# Patient Record
Sex: Female | Born: 1970 | Race: White | Hispanic: No | Marital: Single | Smoking: Current every day smoker
Health system: Southern US, Community
[De-identification: ages and names within clinical notes are randomized; demographics above are authoritative.]

## PROBLEM LIST (undated history)

## (undated) DIAGNOSIS — E119 Type 2 diabetes mellitus without complications: Secondary | ICD-10-CM

## (undated) DIAGNOSIS — F32A Depression, unspecified: Secondary | ICD-10-CM

## (undated) DIAGNOSIS — F319 Bipolar disorder, unspecified: Secondary | ICD-10-CM

## (undated) DIAGNOSIS — E7800 Pure hypercholesterolemia, unspecified: Secondary | ICD-10-CM

## (undated) DIAGNOSIS — Z9109 Other allergy status, other than to drugs and biological substances: Secondary | ICD-10-CM

## (undated) DIAGNOSIS — Z720 Tobacco use: Secondary | ICD-10-CM

## (undated) DIAGNOSIS — J449 Chronic obstructive pulmonary disease, unspecified: Secondary | ICD-10-CM

## (undated) DIAGNOSIS — L309 Dermatitis, unspecified: Secondary | ICD-10-CM

## (undated) DIAGNOSIS — F329 Major depressive disorder, single episode, unspecified: Secondary | ICD-10-CM

## (undated) HISTORY — PX: HEMORRHOID SURGERY: SHX153

## (undated) HISTORY — DX: Chronic obstructive pulmonary disease, unspecified: J44.9

## (undated) HISTORY — PX: THERAPEUTIC ABORTION: SHX798

## (undated) HISTORY — DX: Tobacco use: Z72.0

## (undated) HISTORY — PX: FOOT SURGERY: SHX648

---

## 1998-08-24 ENCOUNTER — Ambulatory Visit (HOSPITAL_BASED_OUTPATIENT_CLINIC_OR_DEPARTMENT_OTHER): Admission: RE | Admit: 1998-08-24 | Discharge: 1998-08-24 | Payer: Self-pay | Admitting: Otolaryngology

## 2001-10-11 ENCOUNTER — Ambulatory Visit (HOSPITAL_BASED_OUTPATIENT_CLINIC_OR_DEPARTMENT_OTHER): Admission: RE | Admit: 2001-10-11 | Discharge: 2001-10-11 | Payer: Self-pay | Admitting: General Surgery

## 2002-05-06 ENCOUNTER — Other Ambulatory Visit: Admission: RE | Admit: 2002-05-06 | Discharge: 2002-05-06 | Payer: Self-pay | Admitting: Obstetrics and Gynecology

## 2002-12-04 ENCOUNTER — Ambulatory Visit (HOSPITAL_COMMUNITY): Admission: RE | Admit: 2002-12-04 | Discharge: 2002-12-04 | Payer: Self-pay | Admitting: Obstetrics & Gynecology

## 2002-12-16 ENCOUNTER — Encounter: Admission: RE | Admit: 2002-12-16 | Discharge: 2002-12-16 | Payer: Self-pay | Admitting: Obstetrics and Gynecology

## 2003-03-08 ENCOUNTER — Inpatient Hospital Stay (HOSPITAL_COMMUNITY): Admission: AD | Admit: 2003-03-08 | Discharge: 2003-03-11 | Payer: Self-pay | Admitting: Obstetrics and Gynecology

## 2003-03-08 ENCOUNTER — Encounter (INDEPENDENT_AMBULATORY_CARE_PROVIDER_SITE_OTHER): Payer: Self-pay

## 2003-05-18 ENCOUNTER — Emergency Department (HOSPITAL_COMMUNITY): Admission: EM | Admit: 2003-05-18 | Discharge: 2003-05-18 | Payer: Self-pay | Admitting: Emergency Medicine

## 2003-09-30 ENCOUNTER — Other Ambulatory Visit: Admission: RE | Admit: 2003-09-30 | Discharge: 2003-09-30 | Payer: Self-pay | Admitting: Obstetrics & Gynecology

## 2004-07-07 ENCOUNTER — Emergency Department (HOSPITAL_COMMUNITY): Admission: EM | Admit: 2004-07-07 | Discharge: 2004-07-07 | Payer: Self-pay | Admitting: Emergency Medicine

## 2004-09-30 ENCOUNTER — Other Ambulatory Visit: Admission: RE | Admit: 2004-09-30 | Discharge: 2004-09-30 | Payer: Self-pay | Admitting: Obstetrics & Gynecology

## 2006-01-01 ENCOUNTER — Other Ambulatory Visit: Admission: RE | Admit: 2006-01-01 | Discharge: 2006-01-01 | Payer: Self-pay | Admitting: Obstetrics and Gynecology

## 2009-08-31 ENCOUNTER — Ambulatory Visit: Admission: RE | Admit: 2009-08-31 | Discharge: 2009-08-31 | Payer: Self-pay | Admitting: Family Medicine

## 2009-08-31 ENCOUNTER — Encounter: Payer: Self-pay | Admitting: Family Medicine

## 2009-08-31 ENCOUNTER — Ambulatory Visit: Payer: Self-pay | Admitting: Surgery

## 2011-03-27 ENCOUNTER — Other Ambulatory Visit: Payer: Self-pay | Admitting: Obstetrics & Gynecology

## 2011-04-04 ENCOUNTER — Other Ambulatory Visit: Payer: Self-pay | Admitting: Obstetrics & Gynecology

## 2011-04-04 DIAGNOSIS — R928 Other abnormal and inconclusive findings on diagnostic imaging of breast: Secondary | ICD-10-CM

## 2011-04-06 ENCOUNTER — Ambulatory Visit
Admission: RE | Admit: 2011-04-06 | Discharge: 2011-04-06 | Disposition: A | Discharge status: Home / Self Care | Payer: BLUE CROSS/BLUE SHIELD | Source: Ambulatory Visit | Attending: Obstetrics & Gynecology | Admitting: Obstetrics & Gynecology

## 2011-04-06 DIAGNOSIS — R928 Other abnormal and inconclusive findings on diagnostic imaging of breast: Secondary | ICD-10-CM

## 2011-05-12 NOTE — Op Note (Signed)
[place]  Patient:    Monica Todd, Monica Todd Visit Number: [id number] MRN: [id number]          Service Type: DSU Location: [facility] Attending Physician:  Tempie Donning Dictated by:   Gita Kudo, M.D. Proc. Date: 10/11/01 Admit Date:  10/11/2001 Discharge Date: 10/11/2001   CC:         Aura Dials, M.D.   Operative Report  PREOPERATIVE DIAGNOSIS:  Prolapsing, bleeding hemorrhoid.  POSTOPERATIVE DIAGNOSIS:  Prolapsing, bleeding hemorrhoid, with thick subcutaneous deep rectal nodule at about 5 cm.  PROCEDURES:  Anoscopy, hemorrhoidectomy, biopsy of deep rectal nodule.  SURGEON:  Gita Kudo, M.D.  ANESTHESIA:  General.  CLINICAL SUMMARY:  A 40 year old woman with seven-year history of prolapsing, bleeding mass from the rectum.  It recently became inflamed and attempted outpatient therapy was not successful.  The anoscope showed a large, somewhat pedunculated, thickened area consistent with a prolapsing internal hemorrhoids of chronic nature.  FINDINGS:  Anoscope inserted and in the lithotomy position, careful examination performed.  Digital exam showed a pedunculated, firm mass in the right posterolateral position.  This was confirmed by anoscopy.  In addition, proximal to this there was a firmness in the submucosal area.  The sphincter was identified and not injured.  DESCRIPTION OF PROCEDURE:  Under satisfactory general endotracheal anesthesia, the patient was placed in the lithotomy position and prepped and draped in the standard fashion.  Digital and anoscopic examination performed.  Then the anoscope was inserted and the right posterolateral area well exposed.  A hemostat was placed on the hemorrhoidal complex, an Allis clamp proximal to this, and then a hemostat on the skin distally.  A figure-of-eight suture was placed above the hemorrhoid, and 2-0 chromic was used.  Then a diamond-shaped incision was made,  conserving mucosa, and this was carried around into the submucosa and the sphincter was identified and not injured.  The dissection was begun distally and then when the hemorrhoidal complex was dissected off the underlying tissue, it was secured with a suture ligature of the same 2-0 chromic.  This mass was then cut out.  I then felt the firm area, which was actually above the apex of the hemorrhoid.  It was rounded and about 1 cm in size.  It may have been just a result of chronic inflammation from this, but I grasped it with an Allis clamp and did an incisional biopsy.  Following this, the wound was then approximated with interrupted simple and figure-of-eight 2-0 chromic sutures, taking wide bites of mucosa and submucosa and smaller bites of the underlying muscle.  This was carried out as a subcuticular suture.  The wound was then infiltrated with 20 cc of 0.5% Marcaine with epinephrine for postop analgesia.  Anoscope inserted, and there was no evidence of any bleeding or problem.  Digital examination showed no stenosis. A light packing was inserted and then a dressing applied.  She tolerated the procedure well and went to the recovery room in good condition. Dictated by:   Gita Kudo, M.D. Attending Physician:  Tempie Donning DD:  10/11/01 TD:  10/14/01 Job: [id number]

## 2011-05-12 NOTE — Discharge Summary (Signed)
   NAME:  Monica Todd, Monica Todd NO.:  [id number]   MEDICAL RECORD NO.:  [id number]                   PATIENT TYPE:  INP   LOCATION:  9126                                 FACILITY:  [facility]   PHYSICIAN:  Randye Lobo, M.D.                DATE OF BIRTH:  1971/01/19   DATE OF ADMISSION:  03/08/2003  DATE OF DISCHARGE:  03/11/2003                                 DISCHARGE SUMMARY   FINAL DIAGNOSES:  1. Intrauterine pregnancy at [redacted] weeks gestation.  2. Failure to progress/descend.   PROCEDURE:  Primary low transverse cesarean section.   SURGEON:  Gerrit Friends. Aldona Bar, M.D.   COMPLICATIONS:  None.   HISTORY OF PRESENT ILLNESS:  This 40 year old G2, P0-0-1-0 presents at [redacted]  weeks gestation in active labor.  Amniotomy was carried out with the  production of meconium stained fluid and Pitocin augmentation was begun.  The patient's antepartum course had been complicated by Rh negative status.  She did receive RhoGAM at 28 weeks, gestational diabetes mellitus which was  diet controlled, and she also was a smoker and did not quit during her  pregnancy.  At this point patient dilated to 7 or 8 cm but because of the  failure to descend she was taken to the operating room by Gerrit Friends. Aldona Bar,  M.D. where patient was going to be taken for a primary low transverse  cesarean section.  The patient was taken to the operating room on March 08, 2003 by Gerrit Friends. Aldona Bar, M.D. where a primary low transverse cesarean section  was performed with the delivery of a 7 pound 13 ounce female infant with  Apgars of 8 and 9.  Delivery went without complication.  The patient's  postoperative course was benign without significant fevers.  The patient did  receive her RhoGAM before discharge.  She was felt ready for discharge on  postoperative day number three.  Was sent home on a regular diet.  Told to  decrease activities.  Told to continue prenatal vitamins and FeSo4 325 mg  one b.i.d.  Was given  a prescription for Percocet one to two q.4h. as needed  for pain.  Told she could use over-the-counter ibuprofen as needed.  Was to  follow up in the office in four weeks.   DISCHARGE LABORATORIES:  The patient had a hemoglobin of 10.9, white blood  cell count of 19.0.     Leilani Able, P.A.-C.                Randye Lobo, M.D.    MB/MEDQ  D:  04/09/2003  T:  04/09/2003  Job:  [id number]

## 2011-05-12 NOTE — Op Note (Signed)
NAME:  Monica Todd, Monica Todd NO.:  [id number]   MEDICAL RECORD NO.:  [id number]                   PATIENT TYPE:  OUT   LOCATION:  MLAB                                 FACILITY:  [facility]   PHYSICIAN:  Gerrit Friends. Aldona Bar, M.D.                DATE OF BIRTH:  03/07/1971   DATE OF PROCEDURE:  03/08/2003  DATE OF DISCHARGE:  12/04/2002                                 OPERATIVE REPORT   PREOPERATIVE DIAGNOSES:  1. Intrauterine pregnancy at 41 weeks.  2. Failure to progress/descend.   POSTOPERATIVE DIAGNOSES:  1. Intrauterine pregnancy at 41 weeks.  2. Failure to progress/descend.  3. Delivery of 7 pound 13 ounce female infant, Apgars 8 and 9.   PROCEDURE:  Primary low transverse cesarean section.   SURGEON:  Gerrit Friends. Aldona Bar, M.D.   ANESTHESIA:  Epidural.   INDICATIONS:  This 40 year old gravida 2, para 0, was admitted earlier today  in labor at 99 weeks' gestation.  She progressed to approximately 6 cm of  dilatation although the vertex remained at -2 station.  Amniotomy was  carried out at this time with production of meconium-stained amniotic fluid.  The patient was aminoinfused.  Pitocin augmentation was begun because of a  poor contraction pattern in a patient over the next two to three hours who  progressed to about 7-8 cm dilation but the vertex remained at -2.  Because  of the failure to descend, the patient is now taken to the operating room  for delivery by primary low transverse cesarean section.   DESCRIPTION OF PROCEDURE:  The patient was taken to the operating room where  an epidural was augmented without difficulty.  Thereafter she was prepped  and draped having been placed on the operating room table in the supine  position slightly tilted to the left.  A Foley catheter was previously  inserted.   After she was adequately draped and good anesthetic levels were documented,  the procedure was begun.   A Pfannenstiel incision was made and with  minimal difficulty dissected down  sharply to and through the fascia in a low transverse fashion with  hemostasis created at each layer.  Subfascial space was created inferiorly  and superiorly and muscles separated in the midline.  The peritoneum was  identified and entered appropriately with care taken to avoid the bowel  superiorly and the bladder inferiorly.  At this time, the vesicouterine  peritoneum was incised in a low transverse fashion and pushed off the lower  uterine segment with ease.  Sharp incisions into the uterus in a low  transverse fashion was then made with the Metzenbaum scissors and extended  laterally with the fingers.  There was some thin meconium noted.  At this  time with minimal difficulty, the vertex was delivered and DeLeed.  No  meconium was produced.   The shoulders were then delivered as was the remainder of  the baby.  The  infant cried spontaneously once.  The cord was clamped and cut.  The infant  was passed off to the awaiting team, and thereafter the patient was taken to  the nursery in good condition.  Apgars were noted to be 8 and 9, and  subsequent weight was found to be 7 pounds 13 ounces.   Due to an oversight, cord bloods were not collected.  The placenta was  delivered intact.  The uterus was then exteriorized and rendered free of any  remaining products of conception.  Good uterine contractility was afforded  with slowly given intravenous Pitocin and manual stimulation.  Tubes and  ovaries were noted to be normal.  At this time, the uterine incision was  closed with #1 Vicryl in a running locked fashion and several figure-of-  eight #1 Vicryls applied for additional hemostasis.  At this time, with the  uterine incision dry, tubes and ovaries normal, uterus well contracted, the  abdomen was lavaged of all free blood and clot and the uterus was replaced  into the abdominal cavity and abdomen was lavaged of all free blood and  clot.  Closure of  the abdomen was begun at this time in layers after all  counts were noted to be correct and no foreign bodies were noted in the  abdominal cavity.   The abdominal peritoneum was closed with 0 Vicryl in a running fashion and  muscles secured with same.  Assured of good subfascial hemostasis, the  fascia was then reapproximated with 0 Vicryl from the angle to the midline  bilaterally.  Subcu tissue was then rendered hemostatic and staples were  then used to close the skin.  A sterile bulky dressing was applied, and the  patient at this time was transported to the recovery area in satisfactory  condition having tolerated the procedure well.  Estimated blood loss was 500  mL.  All counts were correct x2.   SUMMARY:  This patient underwent a primary low transverse cesarean section  because of failure to descend/failure to progress in labor.  She was  delivered of a 7 pound 13 ounce female infant with Apgars of 8 and 9 and at  the conclusion of the procedure both mother and baby were doing well in  their respective recovery areas.                                               Gerrit Friends. Aldona Bar, M.D.    RMW/MEDQ  D:  03/08/2003  T:  03/09/2003  Job:  [id number]

## 2011-12-29 ENCOUNTER — Ambulatory Visit (INDEPENDENT_AMBULATORY_CARE_PROVIDER_SITE_OTHER): Payer: BLUE CROSS/BLUE SHIELD

## 2011-12-29 DIAGNOSIS — R059 Cough, unspecified: Secondary | ICD-10-CM

## 2011-12-29 DIAGNOSIS — J40 Bronchitis, not specified as acute or chronic: Secondary | ICD-10-CM

## 2011-12-29 DIAGNOSIS — R05 Cough: Secondary | ICD-10-CM

## 2011-12-29 DIAGNOSIS — J45909 Unspecified asthma, uncomplicated: Secondary | ICD-10-CM

## 2011-12-29 DIAGNOSIS — L241 Irritant contact dermatitis due to oils and greases: Secondary | ICD-10-CM

## 2012-03-31 ENCOUNTER — Emergency Department (HOSPITAL_COMMUNITY)
Admission: EM | Admit: 2012-03-31 | Discharge: 2012-03-31 | Disposition: A | Discharge status: Home / Self Care | Payer: BC Managed Care – PPO | Attending: Emergency Medicine | Admitting: Emergency Medicine

## 2012-03-31 ENCOUNTER — Encounter (HOSPITAL_COMMUNITY): Payer: Self-pay | Admitting: *Deleted

## 2012-03-31 DIAGNOSIS — J45909 Unspecified asthma, uncomplicated: Secondary | ICD-10-CM | POA: Insufficient documentation

## 2012-03-31 DIAGNOSIS — F419 Anxiety disorder, unspecified: Secondary | ICD-10-CM

## 2012-03-31 DIAGNOSIS — F329 Major depressive disorder, single episode, unspecified: Secondary | ICD-10-CM | POA: Insufficient documentation

## 2012-03-31 DIAGNOSIS — F411 Generalized anxiety disorder: Secondary | ICD-10-CM | POA: Insufficient documentation

## 2012-03-31 DIAGNOSIS — F3289 Other specified depressive episodes: Secondary | ICD-10-CM | POA: Insufficient documentation

## 2012-03-31 DIAGNOSIS — Z760 Encounter for issue of repeat prescription: Secondary | ICD-10-CM

## 2012-03-31 DIAGNOSIS — F17200 Nicotine dependence, unspecified, uncomplicated: Secondary | ICD-10-CM | POA: Insufficient documentation

## 2012-03-31 HISTORY — DX: Depression, unspecified: F32.A

## 2012-03-31 HISTORY — DX: Major depressive disorder, single episode, unspecified: F32.9

## 2012-03-31 HISTORY — DX: Other allergy status, other than to drugs and biological substances: Z91.09

## 2012-03-31 HISTORY — DX: Dermatitis, unspecified: L30.9

## 2012-03-31 MED ORDER — VENLAFAXINE HCL ER 150 MG PO CP24: 300.0000 mg | ORAL_CAPSULE | Freq: Every day | ORAL | Status: DC

## 2012-03-31 MED ORDER — LORAZEPAM 1 MG PO TABS
1.0000 mg | ORAL_TABLET | Freq: Once | ORAL | Status: AC
  Administered 2012-03-31: 1 mg via ORAL
  Filled 2012-03-31: qty 1

## 2012-03-31 NOTE — ED Notes (Signed)
Patient stopped taking Effexor because of insurance not covering the doctor visits. She upped her dosage of Wellbutrin since she stopped taking it. For three days . All of these decisions were unsupervised by MD.

## 2012-03-31 NOTE — ED Provider Notes (Signed)
History     CSN: [id number]  Arrival date & time 03/31/12  4098   First MD Initiated Contact with Patient 03/31/12 [phone]      Chief Complaint  Patient presents with  . Withdrawal    Effexor    (Consider location/radiation/quality/duration/timing/severity/associated sxs/prior treatment) The history is provided by the patient.   the patient reports she has run out of her Effexor and that she is feeling slightly anxious.  She requests medication refill.  She has no HI or SI.  Her husband reports that she has had some increased pressured speech lately.  She had financial issues but did not we'll allow her to refill her Effexor but now she has the resources to do this.  She has no other complaints  Past Medical History  Diagnosis Date  . Eczema   . Asthma   . Depression   . Environmental allergies     History reviewed. No pertinent past surgical history.  History reviewed. No pertinent family history.  History  Substance Use Topics  . Smoking status: Current Everyday Smoker -- 2.0 packs/day    Types: Cigarettes  . Smokeless tobacco: Not on file  . Alcohol Use: Yes     occasionally    OB History    Grav Para Term Preterm Abortions TAB SAB Ect Mult Living                  Review of Systems  All other systems reviewed and are negative.    Allergies  Penicillins and Sulfa antibiotics  Home Medications   Current Outpatient Rx  Name Route Sig Dispense Refill  . ALBUTEROL SULFATE HFA 108 (90 BASE) MCG/ACT IN AERS Inhalation Inhale 2 puffs into the lungs every 6 (six) hours as needed.    . BUPROPION HCL ER (SR) 150 MG PO TB12 Oral Take 300 mg by mouth 2 (two) times daily.    . TRIAMCINOLONE ACETONIDE 0.1 % EX CREA Topical Apply 1 application topically as needed. Itchy rash    . VENLAFAXINE HCL ER 150 MG PO CP24 Oral Take 2 capsules (300 mg total) by mouth daily. 60 capsule 0    BP 122/68  Pulse 51  Temp(Src) 98.6 F (37 C) (Oral)  Resp 19  SpO2 99%  LMP  03/28/2012  Physical Exam  Nursing note and vitals reviewed. Constitutional: She is oriented to person, place, and time. She appears well-developed and well-nourished.  HENT:  Head: Normocephalic.  Eyes: EOM are normal.  Neck: Normal range of motion.  Pulmonary/Chest: Effort normal.  Musculoskeletal: Normal range of motion.  Neurological: She is alert and oriented to person, place, and time.  Psychiatric:       Anxious    ED Course  Procedures (including critical care time)  Labs Reviewed - No data to display No results found.   1. Anxiety   2. Medication refill       MDM  Medication refill.  The patient has no HI or SI.  She's does not need any acute intervention at this time.        Lyanne Co, MD 03/31/12 [phone]

## 2012-03-31 NOTE — ED Notes (Signed)
Pt has decreased Effexor to zero x 3 days ago w/o physician supervision of stopping medication. Pt is presently experiencing anxiety/panic, shakiness, nausea and is tearful.

## 2012-03-31 NOTE — Discharge Instructions (Signed)
Medication Refill, Emergency Department  We have refilled your medication today as a courtesy to you. It is best for your medical care, however, to take care of getting refills done through your primary caregiver's office. They have your records and can do a better job of follow-up than we can in the emergency department.  On maintenance medications, we often only prescribe enough medications to get you by until you are able to see your regular caregiver. This is a more expensive way to refill medications.  In the future, please plan for refills so that you will not have to use the emergency department for this.  Thank you for your help. Your help allows us to better take care of the daily emergencies that enter our department.  Document Released: 03/29/2004 Document Revised: 11/30/2011 Document Reviewed: 12/11/2005  [employer] Patient Information 2012 [employer].

## 2012-03-31 NOTE — ED Notes (Signed)
Pt states she stopped taking her Effexor because she no longer has an rx for it.  Pt states her last dose was 3 days ago and she is now restless, tearful and anxious.  Pt states she woke up at 5:30 this morning "feeling like [I] was in a box."  Pt would like to resume taking her Effexor and plans to become reassociated with a mental health provider to manage her medications.

## 2012-07-08 ENCOUNTER — Encounter (HOSPITAL_COMMUNITY): Payer: Self-pay | Admitting: Emergency Medicine

## 2012-07-08 ENCOUNTER — Emergency Department (HOSPITAL_COMMUNITY)
Admission: EM | Admit: 2012-07-08 | Discharge: 2012-07-08 | Disposition: A | Discharge status: Home / Self Care | Payer: BC Managed Care – PPO | Attending: Emergency Medicine | Admitting: Emergency Medicine

## 2012-07-08 DIAGNOSIS — F17200 Nicotine dependence, unspecified, uncomplicated: Secondary | ICD-10-CM | POA: Insufficient documentation

## 2012-07-08 DIAGNOSIS — F319 Bipolar disorder, unspecified: Secondary | ICD-10-CM | POA: Insufficient documentation

## 2012-07-08 DIAGNOSIS — M7989 Other specified soft tissue disorders: Secondary | ICD-10-CM | POA: Insufficient documentation

## 2012-07-08 DIAGNOSIS — E7800 Pure hypercholesterolemia, unspecified: Secondary | ICD-10-CM | POA: Insufficient documentation

## 2012-07-08 DIAGNOSIS — R21 Rash and other nonspecific skin eruption: Secondary | ICD-10-CM | POA: Insufficient documentation

## 2012-07-08 DIAGNOSIS — Z79899 Other long term (current) drug therapy: Secondary | ICD-10-CM | POA: Insufficient documentation

## 2012-07-08 DIAGNOSIS — J45909 Unspecified asthma, uncomplicated: Secondary | ICD-10-CM | POA: Insufficient documentation

## 2012-07-08 HISTORY — DX: Pure hypercholesterolemia, unspecified: E78.00

## 2012-07-08 HISTORY — DX: Bipolar disorder, unspecified: F31.9

## 2012-07-08 MED ORDER — PREDNISONE 20 MG PO TABS: 60.0000 mg | ORAL_TABLET | Freq: Every day | ORAL | Status: AC

## 2012-07-08 MED ORDER — HYDROCORTISONE 2.5 % EX LOTN: TOPICAL_LOTION | Freq: Two times a day (BID) | CUTANEOUS | Status: AC

## 2012-07-08 MED ORDER — CEPHALEXIN 500 MG PO CAPS: 500.0000 mg | ORAL_CAPSULE | Freq: Four times a day (QID) | ORAL | Status: AC

## 2012-07-08 NOTE — ED Notes (Signed)
Pt states she has a rash all over and starting yesterday she started having swelling to her inner right thigh   Pt states the swelling is tender to touch  Pt states she has eczema but this rash is worse than normal

## 2012-07-08 NOTE — ED Notes (Signed)
Patient discharge to home with written and verbal instructions. With a steady gait.

## 2012-07-13 NOTE — ED Provider Notes (Signed)
Medical screening examination/treatment/procedure(s) were performed by non-physician practitioner and as supervising physician I was immediately available for consultation/collaboration.  Champayne Kocian L Hallelujah Wysong, MD 07/13/12 1510 

## 2012-07-13 NOTE — ED Provider Notes (Signed)
History     CSN: [id number]  Arrival date & time 07/08/12  1940   First MD Initiated Contact with Patient 07/08/12 2038      Chief Complaint  Patient presents with  . Rash  . Leg Swelling    (Consider location/radiation/quality/duration/timing/severity/associated sxs/prior treatment) Patient is a 41 y.o. female presenting with rash. The history is provided by the patient.  Rash  This is a new problem. The current episode started yesterday. The problem has been gradually worsening. Associated with: Possible poison ivy. There has been no fever. The rash is present on the right upper leg, right lower leg, left lower leg, left upper leg and torso. The patient is experiencing no pain. Associated symptoms include itching. Pertinent negatives include no blisters, no pain and no weeping. She has tried antihistamines for the symptoms. The treatment provided mild relief.    Past Medical History  Diagnosis Date  . Eczema   . Asthma   . Depression   . Environmental allergies   . Bipolar 1 disorder   . High cholesterol     Past Surgical History  Procedure Date  . Hemorrhoid surgery   . Therapeutic abortion   . Foot surgery   . Cesarean section     Family History  Problem Relation Age of Onset  . Diabetes Other   . Cancer Other   . Coronary artery disease Other   . Asthma Other     History  Substance Use Topics  . Smoking status: Current Everyday Smoker -- 2.0 packs/day    Types: Cigarettes  . Smokeless tobacco: Not on file  . Alcohol Use: Yes     rare    OB History    Grav Para Term Preterm Abortions TAB SAB Ect Mult Living                  Review of Systems  Constitutional: Negative for fever and chills.  HENT: Negative for trouble swallowing.   Respiratory: Negative for shortness of breath.   Gastrointestinal: Negative for nausea and vomiting.  Musculoskeletal: Negative for joint swelling.  Skin: Positive for itching and rash.    Allergies  Penicillins and  Sulfa antibiotics  Home Medications   Current Outpatient Rx  Name Route Sig Dispense Refill  . ALBUTEROL SULFATE HFA 108 (90 BASE) MCG/ACT IN AERS Inhalation Inhale 2 puffs into the lungs every 6 (six) hours as needed. For shortness of breath.    . CARBAMAZEPINE ER (ANTIPSYCH) 100 MG PO CP12 Oral Take 100-200 mg by mouth 2 (two) times daily. 1 cap in am, 2 caps in pm    . CETIRIZINE HCL 10 MG PO TABS Oral Take 10 mg by mouth at bedtime.    Marland Kitchen DIPHENHYDRAMINE HCL 25 MG PO TABS Oral Take 25 mg by mouth every 6 (six) hours as needed. For allergies/sleep.    Marland Kitchen HYDROCORTISONE 1 % EX CREA Topical Apply topically 2 (two) times daily.    Marland Kitchen LAMOTRIGINE 200 MG PO TABS Oral Take 100 mg by mouth daily.    . NEOMYCIN-POLYMYXIN-PRAMOXINE 1 % EX CREA Topical Apply topically 2 (two) times daily.    . VENLAFAXINE HCL 25 MG PO TABS Oral Take 25 mg by mouth daily.    . CEPHALEXIN 500 MG PO CAPS Oral Take 1 capsule (500 mg total) by mouth 4 (four) times daily. 28 capsule 0  . HYDROCORTISONE 2.5 % EX LOTN Topical Apply topically 2 (two) times daily. 59 mL 0  . PREDNISONE 20  MG PO TABS Oral Take 3 tablets (60 mg total) by mouth daily. 15 tablet 0    BP 133/86  Pulse 82  Temp 98.2 F (36.8 C) (Oral)  Resp 20  SpO2 98%  LMP 06/25/2012  Physical Exam  Nursing note and vitals reviewed. Constitutional: She appears well-developed and well-nourished. No distress.  HENT:  Head: Normocephalic and atraumatic.  Mouth/Throat: Oropharynx is clear and moist.  Neck: Normal range of motion. Neck supple.  Cardiovascular: Normal rate, regular rhythm and normal heart sounds.   Pulmonary/Chest: Effort normal and breath sounds normal.  Musculoskeletal: Normal range of motion.  Neurological: She is alert.  Skin: Skin is warm and dry. Rash noted. No petechiae and no purpura noted. Rash is not urticarial. She is not diaphoretic.       Erythematous papular rash located on both arms and the torso.  No active drainage.      Psychiatric: She has a normal mood and affect.    ED Course  Procedures (including critical care time)  Labs Reviewed - No data to display No results found.   1. Rash       MDM  Patient presenting with diffuse rash of the trunk, both arms, and both legs.  Patient afebrile.  No petechiae or purpura.  Patient treated with Hydrocortisone cream and Prednisone.  Patient instructed to follow up with Dermatology if rash does not improve.        Pascal Lux [place], PA-C 07/13/12 [phone]

## 2012-08-25 ENCOUNTER — Ambulatory Visit (INDEPENDENT_AMBULATORY_CARE_PROVIDER_SITE_OTHER): Payer: Self-pay | Admitting: Physician Assistant

## 2012-08-25 VITALS — BP 152/84 | HR 82 | Temp 97.6°F | Resp 18 | Ht 64.0 in | Wt 192.0 lb

## 2012-08-25 DIAGNOSIS — F329 Major depressive disorder, single episode, unspecified: Secondary | ICD-10-CM

## 2012-08-25 DIAGNOSIS — R059 Cough, unspecified: Secondary | ICD-10-CM

## 2012-08-25 DIAGNOSIS — J45909 Unspecified asthma, uncomplicated: Secondary | ICD-10-CM | POA: Insufficient documentation

## 2012-08-25 DIAGNOSIS — F32A Depression, unspecified: Secondary | ICD-10-CM

## 2012-08-25 DIAGNOSIS — F17200 Nicotine dependence, unspecified, uncomplicated: Secondary | ICD-10-CM

## 2012-08-25 DIAGNOSIS — R05 Cough: Secondary | ICD-10-CM

## 2012-08-25 HISTORY — DX: Nicotine dependence, unspecified, uncomplicated: F17.200

## 2012-08-25 HISTORY — DX: Unspecified asthma, uncomplicated: J45.909

## 2012-08-25 MED ORDER — HYDROCOD POLST-CHLORPHEN POLST 10-8 MG/5ML PO LQCR: 5.0000 mL | Freq: Two times a day (BID) | ORAL | Status: DC

## 2012-08-25 MED ORDER — ALBUTEROL SULFATE (2.5 MG/3ML) 0.083% IN NEBU
2.5000 mg | INHALATION_SOLUTION | Freq: Once | RESPIRATORY_TRACT | Status: AC
  Administered 2012-08-25: 2.5 mg via RESPIRATORY_TRACT

## 2012-08-25 MED ORDER — ALBUTEROL SULFATE HFA 108 (90 BASE) MCG/ACT IN AERS: 2.0000 | INHALATION_SPRAY | Freq: Four times a day (QID) | RESPIRATORY_TRACT | Status: DC | PRN

## 2012-08-25 MED ORDER — IPRATROPIUM BROMIDE 0.02 % IN SOLN
0.5000 mg | Freq: Once | RESPIRATORY_TRACT | Status: AC
  Administered 2012-08-25: 0.5 mg via RESPIRATORY_TRACT

## 2012-08-25 MED ORDER — VENLAFAXINE HCL 25 MG PO TABS: 25.0000 mg | ORAL_TABLET | Freq: Every day | ORAL | Status: DC

## 2012-08-25 MED ORDER — BECLOMETHASONE DIPROPIONATE 80 MCG/ACT IN AERS: 1.0000 | INHALATION_SPRAY | RESPIRATORY_TRACT | Status: DC | PRN

## 2012-08-25 MED ORDER — FLUTICASONE PROPIONATE 50 MCG/ACT NA SUSP: 2.0000 | Freq: Every day | NASAL | Status: DC

## 2012-08-25 NOTE — Progress Notes (Signed)
  Subjective:    Patient ID: Monica Todd, female    DOB: 09-27-1971, 41 y.o.   MRN: [id number]  HPI Pt presents to clinic with SOB and problems with her asthma.  She has h/o asthma and typically does not need her albuterol except for 1-2x/month but recently she has been sick and now her allergies are acting up.  She has congestion and cough.  She cannot get a full breath.  She starting having to use her albuterol inhaler at night which gave her minimal relief and over the last several days has had to use it q4h with minimal relief.  She has a cough that is worse at night.  Some seasonal allergy symptoms with congestion but pt is using benadryl daily because zyrtec did not work, she has been on Flonase in the past which did help some.  She has seasonal allergies to mold and pollen. She has h/o depression and is currently trying to find a psychiatrist that she likes, and is getting ready to run out of medication.  She has smoker for 30years 1-2ppd.  Pt just finished about 1 wk ago a 2 wk course of prednisone for a rash from the dermatologist.   Review of Systems  Constitutional: Negative for fever and chills.  HENT: Positive for congestion.   Respiratory: Positive for cough, shortness of breath and wheezing. Negative for chest tightness.        Objective:   Physical Exam  Vitals reviewed. Constitutional: She is oriented to person, place, and time.  Cardiovascular: Normal rate, regular rhythm and normal heart sounds.   Pulmonary/Chest: She is in respiratory distress (mild - upon my 1st questioning patient unable to speak in full sentences). She has wheezes (decrease breath sounds and wheezing through lung field prior to neb treatment).       After pt's neb - pt able to speak in full sentences and wheezing only present at the bases with expiration.  Much better air movement with respirations.  Neurological: She is alert and oriented to person, place, and time.  Skin: Skin is warm and dry.    Psychiatric: She has a normal mood and affect. Her behavior is normal. Judgment and thought content normal.          Assessment & Plan:   1. Cough  ipratropium (ATROVENT) nebulizer solution 0.5 mg, albuterol (PROVENTIL) (2.5 MG/3ML) 0.083% nebulizer solution 2.5 mg, albuterol (PROVENTIL HFA;VENTOLIN HFA) 108 (90 BASE) MCG/ACT inhaler, chlorpheniramine-HYDROcodone (TUSSIONEX PENNKINETIC ER) 10-8 MG/5ML LQCR  2. Smoker    3. Asthma  albuterol (PROVENTIL HFA;VENTOLIN HFA) 108 (90 BASE) MCG/ACT inhaler, beclomethasone (QVAR) 80 MCG/ACT inhaler, fluticasone (FLONASE) 50 MCG/ACT nasal spray  4. Depression  venlafaxine (EFFEXOR) 25 MG tablet   1- believe related to asthma and uncontrolled allergies 2- stop smoking -- will help with inflammation in lungs 3- uncontrolled currently - will add Qvar for inflammation for the upcoming allergy season - pt to use albuterol for SOB/wheezing if not good results consider prednisone taper 4- refilled meds for 1 month - pt to continue to try to find a mental health provider  Pt's questions answered.  She understands the above.

## 2012-09-24 ENCOUNTER — Other Ambulatory Visit: Payer: Self-pay | Admitting: Physician Assistant

## 2012-10-23 ENCOUNTER — Other Ambulatory Visit: Payer: Self-pay | Admitting: Dermatology

## 2013-04-08 ENCOUNTER — Emergency Department (HOSPITAL_COMMUNITY)
Admission: EM | Admit: 2013-04-08 | Discharge: 2013-04-08 | Disposition: A | Discharge status: Home / Self Care | Payer: BC Managed Care – PPO | Attending: Emergency Medicine | Admitting: Emergency Medicine

## 2013-04-08 ENCOUNTER — Encounter (HOSPITAL_COMMUNITY): Payer: Self-pay | Admitting: Emergency Medicine

## 2013-04-08 DIAGNOSIS — Z862 Personal history of diseases of the blood and blood-forming organs and certain disorders involving the immune mechanism: Secondary | ICD-10-CM | POA: Insufficient documentation

## 2013-04-08 DIAGNOSIS — Z79899 Other long term (current) drug therapy: Secondary | ICD-10-CM | POA: Insufficient documentation

## 2013-04-08 DIAGNOSIS — Z872 Personal history of diseases of the skin and subcutaneous tissue: Secondary | ICD-10-CM | POA: Insufficient documentation

## 2013-04-08 DIAGNOSIS — Z3202 Encounter for pregnancy test, result negative: Secondary | ICD-10-CM | POA: Insufficient documentation

## 2013-04-08 DIAGNOSIS — Z8639 Personal history of other endocrine, nutritional and metabolic disease: Secondary | ICD-10-CM | POA: Insufficient documentation

## 2013-04-08 DIAGNOSIS — J45909 Unspecified asthma, uncomplicated: Secondary | ICD-10-CM | POA: Insufficient documentation

## 2013-04-08 DIAGNOSIS — L02619 Cutaneous abscess of unspecified foot: Secondary | ICD-10-CM | POA: Insufficient documentation

## 2013-04-08 DIAGNOSIS — M79609 Pain in unspecified limb: Secondary | ICD-10-CM

## 2013-04-08 DIAGNOSIS — L03119 Cellulitis of unspecified part of limb: Secondary | ICD-10-CM | POA: Insufficient documentation

## 2013-04-08 DIAGNOSIS — L03115 Cellulitis of right lower limb: Secondary | ICD-10-CM

## 2013-04-08 DIAGNOSIS — M7989 Other specified soft tissue disorders: Secondary | ICD-10-CM

## 2013-04-08 DIAGNOSIS — F17200 Nicotine dependence, unspecified, uncomplicated: Secondary | ICD-10-CM | POA: Insufficient documentation

## 2013-04-08 DIAGNOSIS — F319 Bipolar disorder, unspecified: Secondary | ICD-10-CM | POA: Insufficient documentation

## 2013-04-08 DIAGNOSIS — IMO0002 Reserved for concepts with insufficient information to code with codable children: Secondary | ICD-10-CM | POA: Insufficient documentation

## 2013-04-08 LAB — URINALYSIS, ROUTINE W REFLEX MICROSCOPIC
Bilirubin Urine: NEGATIVE
Glucose, UA: NEGATIVE mg/dL
Hgb urine dipstick: NEGATIVE
Ketones, ur: NEGATIVE mg/dL
Nitrite: NEGATIVE
Protein, ur: NEGATIVE mg/dL
Specific Gravity, Urine: 1.014 (ref 1.005–1.030)
Urobilinogen, UA: 0.2 mg/dL (ref 0.0–1.0)
pH: 5.5 (ref 5.0–8.0)

## 2013-04-08 LAB — URINE MICROSCOPIC-ADD ON

## 2013-04-08 LAB — POCT PREGNANCY, URINE: Preg Test, Ur: NEGATIVE

## 2013-04-08 MED ORDER — VANCOMYCIN HCL IN DEXTROSE 1-5 GM/200ML-% IV SOLN
1000.0000 mg | Freq: Once | INTRAVENOUS | Status: AC
  Administered 2013-04-08: 1000 mg via INTRAVENOUS
  Filled 2013-04-08: qty 200

## 2013-04-08 NOTE — ED Provider Notes (Signed)
History    42 year old female with atraumatic right foot and leg pain and swelling. Gradual onset of couple days ago. Progressively worsening. Increased redness. No numbness cavity or loss of strength. She is seen by her dermatologist earlier today who prescribed clindamycin for possible infection. She is also instructed to the emergency room because of concern for possible blood clot. No past history of DVT. No chest pain or shortness of breath. No recent surgery, immobilization or exogenous estrogen use. No fevers or chills. No nausea or vomiting. Not a diabetic.  CSN: [id number]  Arrival date & time 04/08/13  1229   First MD Initiated Contact with Patient 04/08/13 1307      Chief Complaint  Patient presents with  . Leg Swelling    (Consider location/radiation/quality/duration/timing/severity/associated sxs/prior treatment) HPI  Past Medical History  Diagnosis Date  . Eczema   . Asthma   . Depression   . Environmental allergies   . Bipolar 1 disorder   . High cholesterol     Past Surgical History  Procedure Laterality Date  . Hemorrhoid surgery    . Therapeutic abortion    . Foot surgery    . Cesarean section      Family History  Problem Relation Age of Onset  . Diabetes Other   . Cancer Other   . Coronary artery disease Other   . Asthma Other     History  Substance Use Topics  . Smoking status: Current Every Day Smoker -- 1.00 packs/day    Types: Cigarettes  . Smokeless tobacco: Not on file  . Alcohol Use: Yes     Comment: rare    OB History   Grav Para Term Preterm Abortions TAB SAB Ect Mult Living                  Review of Systems  All systems reviewed and negative, other than as noted in HPI.   Allergies  Penicillins; Sulfa antibiotics; and Keflex  Home Medications   Current Outpatient Rx  Name  Route  Sig  Dispense  Refill  . albuterol (PROVENTIL HFA;VENTOLIN HFA) 108 (90 BASE) MCG/ACT inhaler   Inhalation   Inhale 2 puffs into the  lungs every 6 (six) hours as needed. For shortness of breath.         . beclomethasone (QVAR) 80 MCG/ACT inhaler   Inhalation   Inhale 1 puff into the lungs as needed.   1 Inhaler   1   . cetirizine (ZYRTEC) 10 MG tablet   Oral   Take 10 mg by mouth every morning.          . diphenhydrAMINE (BENADRYL) 25 MG tablet   Oral   Take 25 mg by mouth every 6 (six) hours as needed. For allergies/sleep.         Marland Kitchen FLUoxetine (PROZAC) 20 MG capsule   Oral   Take 20 mg by mouth once a week. friday         . fluticasone (FLONASE) 50 MCG/ACT nasal spray   Nasal   Place 2 sprays into the nose daily as needed.         . hydrocortisone 2.5 % lotion   Topical   Apply topically 2 (two) times daily.   59 mL   0   . hydrocortisone cream 1 %   Topical   Apply topically 2 (two) times daily.         Marland Kitchen ibuprofen (ADVIL,MOTRIN) 600 MG tablet   Oral  Take 600 mg by mouth every 6 (six) hours as needed.         . loratadine (CLARITIN) 10 MG tablet   Oral   Take 10 mg by mouth daily.         Marland Kitchen neomycin-polymyxin-pramoxine (NEOSPORIN PLUS) 1 % cream   Topical   Apply topically 2 (two) times daily.         . pramipexole (MIRAPEX) 0.25 MG tablet   Oral   Take 0.125 mg by mouth at bedtime.           BP 124/71  Pulse 82  Temp(Src) 98.5 F (36.9 C) (Oral)  Resp 20  SpO2 98%  Physical Exam  Nursing note and vitals reviewed. Constitutional: She appears well-developed and well-nourished. No distress.  HENT:  Head: Normocephalic and atraumatic.  Eyes: Conjunctivae are normal. Right eye exhibits no discharge. Left eye exhibits no discharge.  Neck: Neck supple.  Cardiovascular: Normal rate, regular rhythm and normal heart sounds.  Exam reveals no gallop and no friction rub.   No murmur heard. Pulmonary/Chest: Effort normal. No respiratory distress.  Coarse breath sounds b/l  Abdominal: Soft. She exhibits no distension. There is no tenderness.  Musculoskeletal: She  exhibits no edema and no tenderness.  Neurological: She is alert.  Skin: Skin is warm and dry.  Scattered eczematous lesions. Mild swelling R foot and ankle. Erythema, increased warmth and tenderness to medial aspect R foot. No fluctuance or induration. NVI distally. No calf tenderness. Calves symmetric as compared to each other.   Psychiatric: She has a normal mood and affect. Her behavior is normal. Thought content normal.    ED Course  Procedures (including critical care time)  Labs Reviewed  URINALYSIS, ROUTINE W REFLEX MICROSCOPIC - Abnormal; Notable for the following:    Leukocytes, UA TRACE (*)    All other components within normal limits  URINE MICROSCOPIC-ADD ON - Abnormal; Notable for the following:    Squamous Epithelial / LPF MANY (*)    Bacteria, UA FEW (*)    All other components within normal limits  POCT PREGNANCY, URINE   No results found.   1. Cellulitis of right foot without toes   2. Smoker       MDM  41yf with atraumatic R foot pain and swelling. Clinically suspect cellulitis. Pt started on doxycycline by dermatologist today. Dose vancomycin given in ED. Will [place] to eval for DVT as this is why she was referred to ED.         Raeford Razor, MD 04/09/13 [phone]

## 2013-04-08 NOTE — ED Notes (Signed)
Pt verbalizes understanding 

## 2013-04-08 NOTE — Progress Notes (Signed)
VASCULAR LAB PRELIMINARY  PRELIMINARY  PRELIMINARY  PRELIMINARY  Right lower extremity venous duplex completed.    Preliminary report:  Right:  No evidence of DVT, superficial thrombosis, or Baker's cyst. Enlargement of the inguinal lymph nodes consistent with inflammation.  Sandia Pfund, RVS 04/08/2013, 3:47 PM

## 2013-04-08 NOTE — ED Notes (Signed)
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 PCP sent her here. Pain 10/10.

## 2013-04-08 NOTE — ED Notes (Signed)
Vascular tech at bedside. °

## 2013-04-12 ENCOUNTER — Other Ambulatory Visit: Payer: Self-pay | Admitting: Physician Assistant

## 2015-04-20 ENCOUNTER — Other Ambulatory Visit: Payer: Self-pay | Admitting: Obstetrics & Gynecology

## 2015-04-21 LAB — CYTOLOGY - PAP

## 2016-12-05 DIAGNOSIS — J209 Acute bronchitis, unspecified: Secondary | ICD-10-CM | POA: Diagnosis not present

## 2016-12-05 DIAGNOSIS — J441 Chronic obstructive pulmonary disease with (acute) exacerbation: Secondary | ICD-10-CM | POA: Diagnosis not present

## 2016-12-05 DIAGNOSIS — J440 Chronic obstructive pulmonary disease with acute lower respiratory infection: Secondary | ICD-10-CM | POA: Diagnosis not present

## 2016-12-26 DIAGNOSIS — J449 Chronic obstructive pulmonary disease, unspecified: Secondary | ICD-10-CM | POA: Diagnosis not present

## 2016-12-26 DIAGNOSIS — J45998 Other asthma: Secondary | ICD-10-CM | POA: Diagnosis not present

## 2017-01-24 DIAGNOSIS — S61310A Laceration without foreign body of right index finger with damage to nail, initial encounter: Secondary | ICD-10-CM | POA: Diagnosis not present

## 2017-02-14 DIAGNOSIS — J209 Acute bronchitis, unspecified: Secondary | ICD-10-CM | POA: Diagnosis not present

## 2017-02-19 ENCOUNTER — Other Ambulatory Visit: Payer: Self-pay | Admitting: Obstetrics & Gynecology

## 2017-02-19 DIAGNOSIS — Z780 Asymptomatic menopausal state: Secondary | ICD-10-CM | POA: Insufficient documentation

## 2017-02-19 DIAGNOSIS — Z124 Encounter for screening for malignant neoplasm of cervix: Secondary | ICD-10-CM | POA: Diagnosis not present

## 2017-02-19 DIAGNOSIS — Z6831 Body mass index (BMI) 31.0-31.9, adult: Secondary | ICD-10-CM | POA: Diagnosis not present

## 2017-02-19 DIAGNOSIS — Z01419 Encounter for gynecological examination (general) (routine) without abnormal findings: Secondary | ICD-10-CM | POA: Diagnosis not present

## 2017-02-19 DIAGNOSIS — Z1231 Encounter for screening mammogram for malignant neoplasm of breast: Secondary | ICD-10-CM | POA: Diagnosis not present

## 2017-02-19 HISTORY — DX: Asymptomatic menopausal state: Z78.0

## 2017-02-20 LAB — CYTOLOGY - PAP

## 2017-02-22 ENCOUNTER — Other Ambulatory Visit: Payer: Self-pay | Admitting: Obstetrics & Gynecology

## 2017-02-22 DIAGNOSIS — R928 Other abnormal and inconclusive findings on diagnostic imaging of breast: Secondary | ICD-10-CM

## 2017-02-26 ENCOUNTER — Ambulatory Visit
Admission: RE | Admit: 2017-02-26 | Discharge: 2017-02-26 | Disposition: A | Discharge status: Home / Self Care | Payer: BLUE CROSS/BLUE SHIELD | Source: Ambulatory Visit | Attending: Obstetrics & Gynecology | Admitting: Obstetrics & Gynecology

## 2017-02-26 DIAGNOSIS — N6489 Other specified disorders of breast: Secondary | ICD-10-CM | POA: Diagnosis not present

## 2017-02-26 DIAGNOSIS — R928 Other abnormal and inconclusive findings on diagnostic imaging of breast: Secondary | ICD-10-CM

## 2017-08-14 DIAGNOSIS — J209 Acute bronchitis, unspecified: Secondary | ICD-10-CM | POA: Diagnosis not present

## 2017-08-14 DIAGNOSIS — J449 Chronic obstructive pulmonary disease, unspecified: Secondary | ICD-10-CM | POA: Diagnosis not present

## 2017-08-14 DIAGNOSIS — J069 Acute upper respiratory infection, unspecified: Secondary | ICD-10-CM | POA: Diagnosis not present

## 2017-08-17 ENCOUNTER — Other Ambulatory Visit: Payer: Self-pay | Admitting: Obstetrics & Gynecology

## 2017-08-17 DIAGNOSIS — N63 Unspecified lump in unspecified breast: Secondary | ICD-10-CM

## 2017-11-19 DIAGNOSIS — J209 Acute bronchitis, unspecified: Secondary | ICD-10-CM | POA: Diagnosis not present

## 2017-12-12 DIAGNOSIS — J209 Acute bronchitis, unspecified: Secondary | ICD-10-CM | POA: Diagnosis not present

## 2017-12-12 DIAGNOSIS — J0100 Acute maxillary sinusitis, unspecified: Secondary | ICD-10-CM | POA: Diagnosis not present

## 2017-12-31 DIAGNOSIS — R062 Wheezing: Secondary | ICD-10-CM | POA: Diagnosis not present

## 2017-12-31 DIAGNOSIS — F17209 Nicotine dependence, unspecified, with unspecified nicotine-induced disorders: Secondary | ICD-10-CM | POA: Diagnosis not present

## 2017-12-31 DIAGNOSIS — J181 Lobar pneumonia, unspecified organism: Secondary | ICD-10-CM | POA: Diagnosis not present

## 2017-12-31 DIAGNOSIS — Z79899 Other long term (current) drug therapy: Secondary | ICD-10-CM | POA: Diagnosis not present

## 2017-12-31 DIAGNOSIS — E785 Hyperlipidemia, unspecified: Secondary | ICD-10-CM | POA: Diagnosis not present

## 2017-12-31 DIAGNOSIS — J309 Allergic rhinitis, unspecified: Secondary | ICD-10-CM | POA: Diagnosis not present

## 2018-01-14 DIAGNOSIS — I10 Essential (primary) hypertension: Secondary | ICD-10-CM | POA: Diagnosis not present

## 2018-01-14 DIAGNOSIS — R0600 Dyspnea, unspecified: Secondary | ICD-10-CM | POA: Diagnosis not present

## 2018-01-14 DIAGNOSIS — R739 Hyperglycemia, unspecified: Secondary | ICD-10-CM | POA: Diagnosis not present

## 2018-01-14 DIAGNOSIS — E781 Pure hyperglyceridemia: Secondary | ICD-10-CM | POA: Diagnosis not present

## 2018-01-24 ENCOUNTER — Encounter: Payer: Self-pay | Admitting: Endocrinology

## 2018-01-24 ENCOUNTER — Ambulatory Visit: Payer: BLUE CROSS/BLUE SHIELD | Admitting: Endocrinology

## 2018-01-24 DIAGNOSIS — E119 Type 2 diabetes mellitus without complications: Secondary | ICD-10-CM | POA: Diagnosis not present

## 2018-01-24 DIAGNOSIS — Z794 Long term (current) use of insulin: Secondary | ICD-10-CM | POA: Diagnosis not present

## 2018-01-24 MED ORDER — GLUCOSE BLOOD VI STRP: 1.0000 | ORAL_STRIP | Freq: Two times a day (BID) | 12 refills | Status: AC

## 2018-01-24 MED ORDER — INSULIN GLARGINE 100 UNIT/ML SOLOSTAR PEN
20.0000 [IU] | PEN_INJECTOR | SUBCUTANEOUS | 99 refills | Status: DC
Start: 2018-01-24 — End: 2018-05-27

## 2018-01-24 NOTE — Progress Notes (Signed)
Subjective:    Patient ID: Monica Todd, female    DOB: 14-Aug-1971, 47 y.o.   MRN: [id number]  HPI pt is referred by Dr York GriceBurgart, for diabetes.  Pt states DM was dx'ed 3 weeks ago (she had GDM in 2003--rx'ed with diet only); she has mild if any neuropathy of the lower extremities; she is unaware of any associated chronic complications; she was rx'ed metformin, but has never been on insulin; pt says her diet and exercise are much better recently; he has never had pancreatitis, pancreatic surgery, severe hypoglycemia or DKA.   Past Medical History:  Diagnosis Date  . Asthma   . Bipolar 1 disorder (HCC)   . Depression   . Eczema   . Environmental allergies   . High cholesterol     Past Surgical History:  Procedure Laterality Date  . CESAREAN SECTION    . FOOT SURGERY    . HEMORRHOID SURGERY    . THERAPEUTIC ABORTION      Social History   Socioeconomic History  . Marital status: Married    Spouse name: Not on file  . Number of children: Not on file  . Years of education: Not on file  . Highest education level: Not on file  Social Needs  . Financial resource strain: Not on file  . Food insecurity - worry: Not on file  . Food insecurity - inability: Not on file  . Transportation needs - medical: Not on file  . Transportation needs - non-medical: Not on file  Occupational History  . Not on file  Tobacco Use  . Smoking status: Current Every Day Smoker    Packs/day: 1.00    Types: Cigarettes  . Smokeless tobacco: Never Used  Substance and Sexual Activity  . Alcohol use: Yes    Comment: rare  . Drug use: No  . Sexual activity: Yes    Birth control/protection: None  Other Topics Concern  . Not on file  Social History Narrative  . Not on file    Current Outpatient Medications on File Prior to Visit  Medication Sig Dispense Refill  . albuterol (PROVENTIL) (2.5 MG/3ML) 0.083% nebulizer solution albuterol sulfate 2.5 mg/3 mL (0.083 %) solution for nebulization  INHALE  1 VIAL EVERY 4-6 HOURS AS NEEDED FOR WHEEZING    . budesonide-formoterol (SYMBICORT) 80-4.5 MCG/ACT inhaler Inhale 2 puffs into the lungs 2 (two) times daily.    . hydrocortisone cream 1 % Apply topically 2 (two) times daily.    Marland Kitchen. ibuprofen (ADVIL,MOTRIN) 600 MG tablet Take 600 mg by mouth every 6 (six) hours as needed.    Marland Kitchen. levocetirizine (XYZAL) 5 MG tablet Take 5 mg by mouth every evening.    Marland Kitchen. lisinopril (PRINIVIL,ZESTRIL) 5 MG tablet Take 5 mg by mouth daily.  0  . metFORMIN (GLUCOPHAGE-XR) 500 MG 24 hr tablet Take 1,000 mg by mouth daily.  0  . neomycin-polymyxin-pramoxine (NEOSPORIN PLUS) 1 % cream Apply topically 2 (two) times daily.    . beclomethasone (QVAR) 80 MCG/ACT inhaler Inhale 1 puff into the lungs as needed. 1 Inhaler 1   No current facility-administered medications on file prior to visit.     Allergies  Allergen Reactions  . Penicillins   . Sulfa Antibiotics   . Keflex [Cephalexin] Rash    Family History  Problem Relation Age of Onset  . Diabetes Other   . Cancer Other   . Coronary artery disease Other   . Asthma Other   . Diabetes Father  BP 126/81 (BP Location: Left Arm, Patient Position: Sitting, Cuff Size: Normal)   Pulse 67   Wt 195 lb 12.8 oz (88.8 kg)   SpO2 97%   BMI 33.61 kg/m    Review of Systems denies headache, chest pain, n/v, urinary frequency, muscle cramps, excessive diaphoresis, cold intolerance, and easy bruising.  She has lost 5 lbs in the past month.  She has fatigue and blurry vision.  She has chronic dyspnea, due to asthma.  Depression is well-controlled.  She has rhinorrhea.    Objective:   Physical Exam VS: see vs page GEN: no distress HEAD: head: no deformity eyes: no periorbital swelling, no proptosis external nose and ears are normal mouth: no lesion seen NECK: supple, thyroid is not enlarged CHEST WALL: no deformity LUNGS: clear to auscultation CV: reg rate and rhythm, no murmur ABD: abdomen is soft, nontender.  no  hepatosplenomegaly.  not distended.  no hernia MUSCULOSKELETAL: muscle bulk and strength are grossly normal.  no obvious joint swelling.  gait is normal and steady EXTEMITIES: no deformity.  no ulcer on the feet.  feet are of normal color and temp.  no edema PULSES: dorsalis pedis intact bilat.  no carotid bruit NEURO:  cn 2-12 grossly intact.   readily moves all 4's.  sensation is intact to touch on the feet SKIN:  Normal texture and temperature.  No rash or suspicious lesion is visible.   NODES:  None palpable at the neck PSYCH: alert, well-oriented.  Does not appear anxious nor depressed.    outside test results are reviewed:  A1c=12.2% TG=2539  I demonstrated pen to pt.  She says she understand how to give  I have reviewed outside records, and summarized: Pt was noted to have severely elevated a1c, and referred here.  She was noted on routine exam to have hyperglycemia and hypertriglyceridemia.  The a1c was then ordered.     Assessment & Plan:  Type 2 DM: new Hypertrig.  Insulin should be first rx. She is rx'ed qd insulin for now.  we'll refine insulin schedule later.  Patient Instructions  good diet and exercise significantly improve the control of your diabetes.  please let me know if you wish to be referred to a dietician.  high blood sugar is very risky to your health.  you should see an eye doctor and dentist every year.  It is very important to get all recommended vaccinations.  Controlling your blood pressure and cholesterol drastically reduces the damage diabetes does to your body.  Those who smoke should quit.  Please discuss these with your doctor.  check your blood sugar twice a day.  vary the time of day when you check, between before the 3 meals, and at bedtime.  also check if you have symptoms of your blood sugar being too high or too low.  please keep a record of the readings and bring it to your next appointment here (or you can bring the meter itself).  You can write it  on any piece of paper.  please call us sooner if your blood sugar goes below 70, or if you have a lot of readings over 200. I have sent a prescription to your pharmacy, for the insulin.  Please start 20 units each morning. Please call or message [place] next week, to tell us how the blood sugar is doing.   Please continue the same metformin for now.   Here is a new meter.  I have sent a prescription to  your pharmacy, for strips.  Insulin will be the first medication for the triglycerides, so please come back for a follow-up appointment in 2 months, fasting.     Food Choices to Lower Your Triglycerides Triglycerides are a type of fat in your blood. High levels of triglycerides can increase the risk of heart disease and stroke. If your triglyceride levels are high, the foods you eat and your eating habits are very important. Choosing the right foods can help lower your triglycerides. What general guidelines do I need to follow?  Lose weight if you are overweight.  Limit or avoid alcohol.  Fill one half of your plate with vegetables and green salads.  Limit fruit to two servings a day. Choose fruit instead of juice.  Make one fourth of your plate whole grains. Look for the word "whole" as the first word in the ingredient list.  Fill one fourth of your plate with lean protein foods.  Enjoy fatty fish (such as salmon, mackerel, sardines, and tuna) three times a week.  Choose healthy fats.  Limit foods high in starch and sugar.  Eat more home-cooked food and less restaurant, buffet, and fast food.  Limit fried foods.  Cook foods using methods other than frying.  Limit saturated fats.  Check ingredient lists to avoid foods with partially hydrogenated oils (trans fats) in them. What foods can I eat? Grains Whole grains, such as whole wheat or whole grain breads, crackers, cereals, and pasta. Unsweetened oatmeal, bulgur, barley, quinoa, or brown rice. Corn or whole wheat flour  tortillas. Vegetables Fresh or frozen vegetables (raw, steamed, roasted, or grilled). Green salads. Fruits All fresh, canned (in natural juice), or frozen fruits. Meat and Other Protein Products Ground beef (85% or leaner), grass-fed beef, or beef trimmed of fat. Skinless chicken or [place]. Ground chicken or [place]. Pork trimmed of fat. All fish and seafood. Eggs. Dried beans, peas, or lentils. Unsalted nuts or seeds. Unsalted canned or dry beans. Dairy Low-fat dairy products, such as skim or 1% milk, 2% or reduced-fat cheeses, low-fat ricotta or cottage cheese, or plain low-fat yogurt. Fats and Oils Tub margarines without trans fats. Light or reduced-fat mayonnaise and salad dressings. Avocado. Safflower, olive, or canola oils. Natural peanut or almond butter. The items listed above may not be a complete list of recommended foods or beverages. Contact your dietitian for more options. What foods are not recommended? Grains White bread. White pasta. White rice. Cornbread. Bagels, pastries, and croissants. Crackers that contain trans fat. Vegetables White potatoes. Corn. Creamed or fried vegetables. Vegetables in a cheese sauce. Fruits Dried fruits. Canned fruit in light or heavy syrup. Fruit juice. Meat and Other Protein Products Fatty cuts of meat. Ribs, chicken wings, bacon, sausage, bologna, salami, chitterlings, fatback, hot dogs, bratwurst, and packaged luncheon meats. Dairy Whole or 2% milk, cream, half-and-half, and cream cheese. Whole-fat or sweetened yogurt. Full-fat cheeses. Nondairy creamers and whipped toppings. Processed cheese, cheese spreads, or cheese curds. Sweets and Desserts Corn syrup, sugars, honey, and molasses. Candy. Jam and jelly. Syrup. Sweetened cereals. Cookies, pies, cakes, donuts, muffins, and ice cream. Fats and Oils Butter, stick margarine, lard, shortening, ghee, or bacon fat. Coconut, palm kernel, or palm oils. Beverages Alcohol. Sweetened drinks (such as  sodas, lemonade, and fruit drinks or punches). The items listed above may not be a complete list of foods and beverages to avoid. Contact your dietitian for more information. This information is not intended to replace advice given to you by your health care  provider. Make sure you discuss any questions you have with your health care provider. Document Released: 09/28/2004 Document Revised: 05/18/2016 Document Reviewed: 10/15/2013 Elsevier Interactive Patient Education  2017 [employer].

## 2018-01-24 NOTE — Patient Instructions (Addendum)
good diet and exercise significantly improve the control of your diabetes.  please let me know if you wish to be referred to a dietician.  high blood sugar is very risky to your health.  you should see an eye doctor and dentist every year.  It is very important to get all recommended vaccinations.  Controlling your blood pressure and cholesterol drastically reduces the damage diabetes does to your body.  Those who smoke should quit.  Please discuss these with your doctor.  check your blood sugar twice a day.  vary the time of day when you check, between before the 3 meals, and at bedtime.  also check if you have symptoms of your blood sugar being too high or too low.  please keep a record of the readings and bring it to your next appointment here (or you can bring the meter itself).  You can write it on any piece of paper.  please call us sooner if your blood sugar goes below 70, or if you have a lot of readings over 200. I have sent a prescription to your pharmacy, for the insulin.  Please start 20 units each morning. Please call or message us next week, to tell us how the blood sugar is doing.   Please continue the same metformin for now.   Here is a new meter.  I have sent a prescription to your pharmacy, for strips.  Insulin will be the first medication for the triglycerides, so please come back for a follow-up appointment in 2 months, fasting.     Food Choices to Lower Your Triglycerides Triglycerides are a type of fat in your blood. High levels of triglycerides can increase the risk of heart disease and stroke. If your triglyceride levels are high, the foods you eat and your eating habits are very important. Choosing the right foods can help lower your triglycerides. What general guidelines do I need to follow?  Lose weight if you are overweight.  Limit or avoid alcohol.  Fill one half of your plate with vegetables and green salads.  Limit fruit to two servings a day. Choose fruit instead  of juice.  Make one fourth of your plate whole grains. Look for the word "whole" as the first word in the ingredient list.  Fill one fourth of your plate with lean protein foods.  Enjoy fatty fish (such as salmon, mackerel, sardines, and tuna) three times a week.  Choose healthy fats.  Limit foods high in starch and sugar.  Eat more home-cooked food and less restaurant, buffet, and fast food.  Limit fried foods.  Cook foods using methods other than frying.  Limit saturated fats.  Check ingredient lists to avoid foods with partially hydrogenated oils (trans fats) in them. What foods can I eat? Grains Whole grains, such as whole wheat or whole grain breads, crackers, cereals, and pasta. Unsweetened oatmeal, bulgur, barley, quinoa, or brown rice. Corn or whole wheat flour tortillas. Vegetables Fresh or frozen vegetables (raw, steamed, roasted, or grilled). Green salads. Fruits All fresh, canned (in natural juice), or frozen fruits. Meat and Other Protein Products Ground beef (85% or leaner), grass-fed beef, or beef trimmed of fat. Skinless chicken or [place]. Ground chicken or [place]. Pork trimmed of fat. All fish and seafood. Eggs. Dried beans, peas, or lentils. Unsalted nuts or seeds. Unsalted canned or dry beans. Dairy Low-fat dairy products, such as skim or 1% milk, 2% or reduced-fat cheeses, low-fat ricotta or cottage cheese, or plain low-fat yogurt. Fats  and Oils Tub margarines without trans fats. Light or reduced-fat mayonnaise and salad dressings. Avocado. Safflower, olive, or canola oils. Natural peanut or almond butter. The items listed above may not be a complete list of recommended foods or beverages. Contact your dietitian for more options. What foods are not recommended? Grains White bread. White pasta. White rice. Cornbread. Bagels, pastries, and croissants. Crackers that contain trans fat. Vegetables White potatoes. Corn. Creamed or fried vegetables. Vegetables in a  cheese sauce. Fruits Dried fruits. Canned fruit in light or heavy syrup. Fruit juice. Meat and Other Protein Products Fatty cuts of meat. Ribs, chicken wings, bacon, sausage, bologna, salami, chitterlings, fatback, hot dogs, bratwurst, and packaged luncheon meats. Dairy Whole or 2% milk, cream, half-and-half, and cream cheese. Whole-fat or sweetened yogurt. Full-fat cheeses. Nondairy creamers and whipped toppings. Processed cheese, cheese spreads, or cheese curds. Sweets and Desserts Corn syrup, sugars, honey, and molasses. Candy. Jam and jelly. Syrup. Sweetened cereals. Cookies, pies, cakes, donuts, muffins, and ice cream. Fats and Oils Butter, stick margarine, lard, shortening, ghee, or bacon fat. Coconut, palm kernel, or palm oils. Beverages Alcohol. Sweetened drinks (such as sodas, lemonade, and fruit drinks or punches). The items listed above may not be a complete list of foods and beverages to avoid. Contact your dietitian for more information. This information is not intended to replace advice given to you by your health care provider. Make sure you discuss any questions you have with your health care provider. Document Released: 09/28/2004 Document Revised: 05/18/2016 Document Reviewed: 10/15/2013 Elsevier Interactive Patient Education  2017 [employer].

## 2018-01-26 DIAGNOSIS — E119 Type 2 diabetes mellitus without complications: Secondary | ICD-10-CM | POA: Insufficient documentation

## 2018-01-27 ENCOUNTER — Other Ambulatory Visit: Payer: Self-pay | Admitting: Endocrinology

## 2018-01-28 ENCOUNTER — Telehealth: Payer: Self-pay | Admitting: Endocrinology

## 2018-01-28 ENCOUNTER — Other Ambulatory Visit: Payer: Self-pay

## 2018-01-28 DIAGNOSIS — J209 Acute bronchitis, unspecified: Secondary | ICD-10-CM | POA: Diagnosis not present

## 2018-01-28 DIAGNOSIS — E1165 Type 2 diabetes mellitus with hyperglycemia: Secondary | ICD-10-CM | POA: Diagnosis not present

## 2018-01-28 DIAGNOSIS — J441 Chronic obstructive pulmonary disease with (acute) exacerbation: Secondary | ICD-10-CM | POA: Diagnosis not present

## 2018-01-28 DIAGNOSIS — E118 Type 2 diabetes mellitus with unspecified complications: Secondary | ICD-10-CM | POA: Diagnosis not present

## 2018-01-28 NOTE — Telephone Encounter (Signed)
glucose blood (CONTOUR NEXT TEST) test strip   Pt stated that pharmacy told her we needed to send prior authorization to her insurance for this,   Please advise

## 2018-01-29 ENCOUNTER — Other Ambulatory Visit: Payer: Self-pay

## 2018-01-29 NOTE — Telephone Encounter (Signed)
I have called LVM with patient to contact to insurance to see what's covered. According to our 2019 formulary contour is what is covered.

## 2018-02-04 ENCOUNTER — Encounter: Payer: Self-pay | Admitting: Endocrinology

## 2018-02-04 NOTE — Telephone Encounter (Signed)
Please continue the same medication for now.  It is expected that that your blood sugar will come down off the prednisone.  I think contour is preferred, but any that is OK with ins is fine with me

## 2018-02-04 NOTE — Telephone Encounter (Signed)
Pt is calling to give someone her Blood sugar reading. States Dr has asked for them so he knows how to adjust her medication  Please advise

## 2018-02-04 NOTE — Telephone Encounter (Signed)
I called and patient did not have meter with her on lunch. She stated that in the morning she has seen a range from 115-140. The evenings 150-170 except when she was on prednisone. She had finished taking that though this weekend. Patient wanted to let you know in case any changes needed to be made.

## 2018-02-04 NOTE — Telephone Encounter (Signed)
Pt is aware, and will speak with Maralyn SagoSarah about the PA for the pts test strips

## 2018-02-05 ENCOUNTER — Other Ambulatory Visit: Payer: Self-pay

## 2018-02-05 NOTE — Telephone Encounter (Signed)
I called patient & she wanted me attempt PA for next strips. I have completed PA & submitted to cover my meds. I am waiting on response but patient stated she may just pay for strips OTC of they aren't approved.

## 2018-02-12 DIAGNOSIS — E118 Type 2 diabetes mellitus with unspecified complications: Secondary | ICD-10-CM | POA: Diagnosis not present

## 2018-02-12 DIAGNOSIS — J449 Chronic obstructive pulmonary disease, unspecified: Secondary | ICD-10-CM | POA: Diagnosis not present

## 2018-02-12 DIAGNOSIS — Z6829 Body mass index (BMI) 29.0-29.9, adult: Secondary | ICD-10-CM | POA: Diagnosis not present

## 2018-02-12 DIAGNOSIS — E1165 Type 2 diabetes mellitus with hyperglycemia: Secondary | ICD-10-CM | POA: Diagnosis not present

## 2018-03-25 ENCOUNTER — Other Ambulatory Visit (INDEPENDENT_AMBULATORY_CARE_PROVIDER_SITE_OTHER): Payer: BLUE CROSS/BLUE SHIELD

## 2018-03-25 ENCOUNTER — Ambulatory Visit (INDEPENDENT_AMBULATORY_CARE_PROVIDER_SITE_OTHER): Payer: BLUE CROSS/BLUE SHIELD | Admitting: Endocrinology

## 2018-03-25 ENCOUNTER — Encounter: Payer: Self-pay | Admitting: Endocrinology

## 2018-03-25 VITALS — BP 110/82 | HR 81 | Wt 163.8 lb

## 2018-03-25 DIAGNOSIS — E781 Pure hyperglyceridemia: Secondary | ICD-10-CM

## 2018-03-25 DIAGNOSIS — Z794 Long term (current) use of insulin: Secondary | ICD-10-CM | POA: Diagnosis not present

## 2018-03-25 DIAGNOSIS — E119 Type 2 diabetes mellitus without complications: Secondary | ICD-10-CM | POA: Diagnosis not present

## 2018-03-25 HISTORY — DX: Pure hyperglyceridemia: E78.1

## 2018-03-25 LAB — LIPID PANEL
Cholesterol: 204 mg/dL — ABNORMAL HIGH (ref 0–200)
HDL: 32.5 mg/dL — ABNORMAL LOW (ref >39.00)
LDL Cholesterol: 137 mg/dL — ABNORMAL HIGH (ref 0–99)
NonHDL: 171.22
Total CHOL/HDL Ratio: 6
Triglycerides: 171 mg/dL — ABNORMAL HIGH (ref 0.0–149.0)
VLDL: 34.2 mg/dL (ref 0.0–40.0)

## 2018-03-25 LAB — POCT GLYCOSYLATED HEMOGLOBIN (HGB A1C): Hemoglobin A1C: 7.2

## 2018-03-25 MED ORDER — PIOGLITAZONE HCL 45 MG PO TABS: 45.0000 mg | ORAL_TABLET | Freq: Every day | ORAL | 3 refills | Status: DC

## 2018-03-25 NOTE — Patient Instructions (Addendum)
check your blood sugar twice a day.  vary the time of day when you check, between before the 3 meals, and at bedtime.  also check if you have symptoms of your blood sugar being too high or too low.  please keep a record of the readings and bring it to your next appointment here (or you can bring the meter itself).  You can write it on any piece of paper.  please call us sooner if your blood sugar goes below 70, or if you have a lot of readings over 200. Please continue the same metformin and insulin for now.   blood tests are requested for you today.  We'll let you know about the results. If the triglycerides are high, we'll add "pioglitizone."  This will also enable us to reduce or stop the insulin. Please come back for a follow-up appointment in 2 months, fasting again.

## 2018-03-25 NOTE — Progress Notes (Signed)
Subjective:    Patient ID: Monica Todd, female   [name] DOB: 02-20-1971, 47 y.o.   MRN: [id number]  HPI  Pt returns for f/u of DM:  DM type: Insulin-requiring type 2.  Dx'ed: early 2019 Complications: none Therapy: insulin since dx, and metformin GDM: 2003 DKA: never.  Severe hypoglycemia: never Pancreatitis: never Pancreatic imaging: never Other: plan is to phase out insulin if possible; she also has hypertriglyceridemia.  Pt reported symptoms: none today CBG's per record brought today.  It varies from 90-168.   Pt takes meds as rx'ed.  Past Medical History:  Diagnosis Date  . Asthma   . Bipolar 1 disorder (HCC)   . Depression   . Eczema   . Environmental allergies   . High cholesterol     Past Surgical History:  Procedure Laterality Date  . CESAREAN SECTION    . FOOT SURGERY    . HEMORRHOID SURGERY    . THERAPEUTIC ABORTION      Social History   Socioeconomic History  . Marital status: Married    Spouse name: Not on file  . Number of children: Not on file  . Years of education: Not on file  . Highest education level: Not on file  Occupational History  . Not on file  Social Needs  . Financial resource strain: Not on file  . Food insecurity:    Worry: Not on file    Inability: Not on file  . Transportation needs:    Medical: Not on file    Non-medical: Not on file  Tobacco Use  . Smoking status: Current Every Day Smoker    Packs/day: 1.00    Types: Cigarettes  . Smokeless tobacco: Never Used  Substance and Sexual Activity  . Alcohol use: Yes    Comment: rare  . Drug use: No  . Sexual activity: Yes    Birth control/protection: None  Lifestyle  . Physical activity:    Days per week: Not on file    Minutes per session: Not on file  . Stress: Not on file  Relationships  . Social connections:    Talks on phone: Not on file    Gets together: Not on file    Attends religious service: Not on file    Active member of club or organization: Not on file     Attends meetings of clubs or organizations: Not on file    Relationship status: Not on file  . Intimate partner violence:    Fear of current or ex partner: Not on file    Emotionally abused: Not on file    Physically abused: Not on file    Forced sexual activity: Not on file  Other Topics Concern  . Not on file  Social History Narrative  . Not on file    Current Outpatient Medications on File Prior to Visit  Medication Sig Dispense Refill  . albuterol (PROVENTIL) (2.5 MG/3ML) 0.083% nebulizer solution albuterol sulfate 2.5 mg/3 mL (0.083 %) solution for nebulization  INHALE 1 VIAL EVERY 4-6 HOURS AS NEEDED FOR WHEEZING    . B-D ULTRAFINE III SHORT PEN 31G X 8 MM MISC USE WITH LANTUS EVERY AM 90 each 3  . budesonide-formoterol (SYMBICORT) 80-4.5 MCG/ACT inhaler Inhale 2 puffs into the lungs 2 (two) times daily.    Marland Kitchen. glucose blood (CONTOUR NEXT TEST) test strip 1 each by Other route 2 (two) times daily. And lancets 2/day 100 each 12  . hydrocortisone cream 1 % Apply  topically 2 (two) times daily.    Marland Kitchen ibuprofen (ADVIL,MOTRIN) 600 MG tablet Take 600 mg by mouth every 6 (six) hours as needed.    . Insulin Glargine (LANTUS SOLOSTAR) 100 UNIT/ML Solostar Pen Inject 20 Units into the skin every morning. And pen needles 1/day 5 pen PRN  . levocetirizine (XYZAL) 5 MG tablet Take 5 mg by mouth every evening.    Marland Kitchen lisinopril (PRINIVIL,ZESTRIL) 5 MG tablet Take 5 mg by mouth daily.  0  . metFORMIN (GLUCOPHAGE-XR) 500 MG 24 hr tablet Take 1,000 mg by mouth daily.  0  . neomycin-polymyxin-pramoxine (NEOSPORIN PLUS) 1 % cream Apply topically 2 (two) times daily.    . beclomethasone (QVAR) 80 MCG/ACT inhaler Inhale 1 puff into the lungs as needed. 1 Inhaler 1   No current facility-administered medications on file prior to visit.     Allergies  Allergen Reactions  . Penicillins   . Sulfa Antibiotics   . Keflex [Cephalexin] Rash    Family History  Problem Relation Age of Onset  . Diabetes  Other   . Cancer Other   . Coronary artery disease Other   . Asthma Other   . Diabetes Father     BP 110/82 (BP Location: Left Arm, Patient Position: Sitting, Cuff Size: Normal)   Pulse 81   Wt 163 lb 12.8 oz (74.3 kg)   SpO2 96%   BMI 28.12 kg/m    Review of Systems She denies hypoglycemia    Objective:   Physical Exam VITAL SIGNS:  See vs page GENERAL: no distress Pulses: dorsalis pedis intact bilat.   MSK: no deformity of the feet CV: no leg edema Skin:  no ulcer on the feet.  normal color and temp on the feet. Neuro: sensation is intact to touch on the feet.     Lab Results  Component Value Date   HGBA1C 7.2 03/25/2018       Assessment & Plan:  Insulin-requiring type 2 DM: improved glycemic control.   Hypertriglyceridemia: we'll add pioglitizone.    Patient Instructions  check your blood sugar twice a day.  vary the time of day when you check, between before the 3 meals, and at bedtime.  also check if you have symptoms of your blood sugar being too high or too low.  please keep a record of the readings and bring it to your next appointment here (or you can bring the meter itself).  You can write it on any piece of paper.  please call us sooner if your blood sugar goes below 70, or if you have a lot of readings over 200. Please continue the same metformin and insulin for now.   blood tests are requested for you today.  We'll let you know about the results. If the triglycerides are high, we'll add "pioglitizone."  This will also enable [place] to reduce or stop the insulin. Please come back for a follow-up appointment in 2 months, fasting again.

## 2018-03-26 DIAGNOSIS — F17209 Nicotine dependence, unspecified, with unspecified nicotine-induced disorders: Secondary | ICD-10-CM | POA: Diagnosis not present

## 2018-03-26 DIAGNOSIS — Z0000 Encounter for general adult medical examination without abnormal findings: Secondary | ICD-10-CM | POA: Diagnosis not present

## 2018-03-26 DIAGNOSIS — Z6829 Body mass index (BMI) 29.0-29.9, adult: Secondary | ICD-10-CM | POA: Diagnosis not present

## 2018-03-26 DIAGNOSIS — E1042 Type 1 diabetes mellitus with diabetic polyneuropathy: Secondary | ICD-10-CM | POA: Diagnosis not present

## 2018-04-25 DIAGNOSIS — G5603 Carpal tunnel syndrome, bilateral upper limbs: Secondary | ICD-10-CM | POA: Diagnosis not present

## 2018-04-25 DIAGNOSIS — Z6829 Body mass index (BMI) 29.0-29.9, adult: Secondary | ICD-10-CM | POA: Diagnosis not present

## 2018-04-25 DIAGNOSIS — E1042 Type 1 diabetes mellitus with diabetic polyneuropathy: Secondary | ICD-10-CM | POA: Diagnosis not present

## 2018-04-25 DIAGNOSIS — J441 Chronic obstructive pulmonary disease with (acute) exacerbation: Secondary | ICD-10-CM | POA: Diagnosis not present

## 2018-04-29 ENCOUNTER — Other Ambulatory Visit: Payer: Self-pay | Admitting: Endocrinology

## 2018-05-09 DIAGNOSIS — Z6827 Body mass index (BMI) 27.0-27.9, adult: Secondary | ICD-10-CM | POA: Diagnosis not present

## 2018-05-09 DIAGNOSIS — J449 Chronic obstructive pulmonary disease, unspecified: Secondary | ICD-10-CM | POA: Diagnosis not present

## 2018-05-09 DIAGNOSIS — F17209 Nicotine dependence, unspecified, with unspecified nicotine-induced disorders: Secondary | ICD-10-CM | POA: Diagnosis not present

## 2018-05-09 DIAGNOSIS — E1042 Type 1 diabetes mellitus with diabetic polyneuropathy: Secondary | ICD-10-CM | POA: Diagnosis not present

## 2018-05-27 ENCOUNTER — Ambulatory Visit: Payer: BLUE CROSS/BLUE SHIELD | Admitting: Endocrinology

## 2018-05-27 ENCOUNTER — Encounter: Payer: Self-pay | Admitting: Endocrinology

## 2018-05-27 VITALS — BP 114/64 | HR 77 | Wt 166.4 lb

## 2018-05-27 DIAGNOSIS — E119 Type 2 diabetes mellitus without complications: Secondary | ICD-10-CM

## 2018-05-27 DIAGNOSIS — E781 Pure hyperglyceridemia: Secondary | ICD-10-CM | POA: Diagnosis not present

## 2018-05-27 DIAGNOSIS — Z794 Long term (current) use of insulin: Secondary | ICD-10-CM | POA: Diagnosis not present

## 2018-05-27 LAB — POCT GLYCOSYLATED HEMOGLOBIN (HGB A1C): Hemoglobin A1C: 6.2 % — AB (ref 4.0–5.6)

## 2018-05-27 NOTE — Patient Instructions (Addendum)
check your blood sugar twice a day.  vary the time of day when you check, between before the 3 meals, and at bedtime.  also check if you have symptoms of your blood sugar being too high or too low.  please keep a record of the readings and bring it to your next appointment here (or you can bring the meter itself).  You can write it on any piece of paper.  please call us sooner if your blood sugar goes below 70, or if you have a lot of readings over 200. Please taper off the insulin, and:  Please continue the same other diabetes medications.  Please come back for a follow-up appointment in 3 months, fasting again.

## 2018-05-27 NOTE — Progress Notes (Signed)
Subjective:    Patient ID: Monica Todd, female    DOB: August 28, 1971, 47 y.o.   MRN: [id number]  HPI Pt returns for f/u of DM:  DM type: Insulin-requiring type 2.  Dx'ed: early 2019 Complications: none Therapy: insulin since dx, and metformin GDM: 2003 DKA: never.  Severe hypoglycemia: never Pancreatitis: never Pancreatic imaging: never Other: plan is to phase out insulin if possible; she also has hypertriglyceridemia.  Interval history: 2 weeks ago, pt was started on prednisone for AB.  lantus was increased to 15 units qd.  She is finished with the steroids, and is tapering the insulin.   Past Medical History:  Diagnosis Date  . Asthma   . Bipolar 1 disorder (HCC)   . Depression   . Eczema   . Environmental allergies   . High cholesterol     Past Surgical History:  Procedure Laterality Date  . CESAREAN SECTION    . FOOT SURGERY    . HEMORRHOID SURGERY    . THERAPEUTIC ABORTION      Social History   Socioeconomic History  . Marital status: Married    Spouse name: Not on file  . Number of children: Not on file  . Years of education: Not on file  . Highest education level: Not on file  Occupational History  . Not on file  Social Needs  . Financial resource strain: Not on file  . Food insecurity:    Worry: Not on file    Inability: Not on file  . Transportation needs:    Medical: Not on file    Non-medical: Not on file  Tobacco Use  . Smoking status: Current Every Day Smoker    Packs/day: 1.00    Types: Cigarettes  . Smokeless tobacco: Never Used  Substance and Sexual Activity  . Alcohol use: Yes    Comment: rare  . Drug use: No  . Sexual activity: Yes    Birth control/protection: None  Lifestyle  . Physical activity:    Days per week: Not on file    Minutes per session: Not on file  . Stress: Not on file  Relationships  . Social connections:    Talks on phone: Not on file    Gets together: Not on file    Attends religious service: Not on file      Active member of club or organization: Not on file    Attends meetings of clubs or organizations: Not on file    Relationship status: Not on file  . Intimate partner violence:    Fear of current or ex partner: Not on file    Emotionally abused: Not on file    Physically abused: Not on file    Forced sexual activity: Not on file  Other Topics Concern  . Not on file  Social History Narrative  . Not on file    Current Outpatient Medications on File Prior to Visit  Medication Sig Dispense Refill  . varenicline (CHANTIX) 1 MG tablet Take 1 mg by mouth 2 (two) times daily.    Marland Kitchen albuterol (PROVENTIL) (2.5 MG/3ML) 0.083% nebulizer solution albuterol sulfate 2.5 mg/3 mL (0.083 %) solution for nebulization  INHALE 1 VIAL EVERY 4-6 HOURS AS NEEDED FOR WHEEZING    . B-D ULTRAFINE III SHORT PEN 31G X 8 MM MISC USE WITH LANTUS EVERY AM 100 each 0  . beclomethasone (QVAR) 80 MCG/ACT inhaler Inhale 1 puff into the lungs as needed. 1 Inhaler 1  . budesonide-formoterol (  SYMBICORT) 80-4.5 MCG/ACT inhaler Inhale 2 puffs into the lungs 2 (two) times daily.    Marland Kitchen. glucose blood (CONTOUR NEXT TEST) test strip 1 each by Other route 2 (two) times daily. And lancets 2/day 100 each 12  . hydrocortisone cream 1 % Apply topically 2 (two) times daily.    Marland Kitchen. ibuprofen (ADVIL,MOTRIN) 600 MG tablet Take 600 mg by mouth every 6 (six) hours as needed.    Marland Kitchen. levocetirizine (XYZAL) 5 MG tablet Take 5 mg by mouth every evening.    Marland Kitchen. lisinopril (PRINIVIL,ZESTRIL) 5 MG tablet Take 5 mg by mouth daily.  0  . metFORMIN (GLUCOPHAGE-XR) 500 MG 24 hr tablet Take 1,000 mg by mouth daily.  0  . neomycin-polymyxin-pramoxine (NEOSPORIN PLUS) 1 % cream Apply topically 2 (two) times daily.    . pioglitazone (ACTOS) 45 MG tablet Take 1 tablet (45 mg total) by mouth daily. 90 tablet 3   No current facility-administered medications on file prior to visit.     Allergies  Allergen Reactions  . Penicillins   . Sulfa Antibiotics   .  Keflex [Cephalexin] Rash    Family History  Problem Relation Age of Onset  . Diabetes Other   . Cancer Other   . Coronary artery disease Other   . Asthma Other   . Diabetes Father     BP 114/64   Pulse 77   Wt 166 lb 6.4 oz (75.5 kg)   SpO2 93%   BMI 28.56 kg/m   Review of Systems She denies hypoglycemia.      Objective:   Physical Exam VITAL SIGNS:  See vs page GENERAL: no distress Pulses: dorsalis pedis intact bilat.   MSK: no deformity of the feet CV: no leg edema Skin:  no ulcer on the feet.  normal color and temp on the feet. Neuro: sensation is intact to touch on the feet  Lab Results  Component Value Date   HGBA1C 6.2 (A) 05/27/2018      Assessment & Plan:  Insulin-requiring type 2 DM: overcontrolled, for this insulin, which does match insulin to her changing needs throughout the day Hypertriglyceridemia: we can skip blood today, as steroids can affect this.   AB: steroids are affecting glycemic control  Patient Instructions  check your blood sugar twice a day.  vary the time of day when you check, between before the 3 meals, and at bedtime.  also check if you have symptoms of your blood sugar being too high or too low.  please keep a record of the readings and bring it to your next appointment here (or you can bring the meter itself).  You can write it on any piece of paper.  please call us sooner if your blood sugar goes below 70, or if you have a lot of readings over 200. Please taper off the insulin, and:  Please continue the same other diabetes medications.  Please come back for a follow-up appointment in 3 months, fasting again.

## 2018-08-19 DIAGNOSIS — F340 Cyclothymic disorder: Secondary | ICD-10-CM | POA: Diagnosis not present

## 2018-08-19 DIAGNOSIS — F331 Major depressive disorder, recurrent, moderate: Secondary | ICD-10-CM | POA: Diagnosis not present

## 2018-08-21 DIAGNOSIS — E119 Type 2 diabetes mellitus without complications: Secondary | ICD-10-CM | POA: Diagnosis not present

## 2018-08-21 DIAGNOSIS — J449 Chronic obstructive pulmonary disease, unspecified: Secondary | ICD-10-CM | POA: Diagnosis not present

## 2018-08-21 DIAGNOSIS — R062 Wheezing: Secondary | ICD-10-CM | POA: Diagnosis not present

## 2018-08-21 DIAGNOSIS — F17209 Nicotine dependence, unspecified, with unspecified nicotine-induced disorders: Secondary | ICD-10-CM | POA: Diagnosis not present

## 2018-08-27 ENCOUNTER — Ambulatory Visit (INDEPENDENT_AMBULATORY_CARE_PROVIDER_SITE_OTHER): Payer: BLUE CROSS/BLUE SHIELD | Admitting: Endocrinology

## 2018-08-27 ENCOUNTER — Encounter: Payer: Self-pay | Admitting: Endocrinology

## 2018-08-27 VITALS — BP 110/62 | HR 59 | Ht 64.0 in | Wt 178.4 lb

## 2018-08-27 DIAGNOSIS — R635 Abnormal weight gain: Secondary | ICD-10-CM

## 2018-08-27 DIAGNOSIS — Z794 Long term (current) use of insulin: Secondary | ICD-10-CM

## 2018-08-27 DIAGNOSIS — E119 Type 2 diabetes mellitus without complications: Secondary | ICD-10-CM | POA: Diagnosis not present

## 2018-08-27 DIAGNOSIS — E781 Pure hyperglyceridemia: Secondary | ICD-10-CM | POA: Diagnosis not present

## 2018-08-27 LAB — POCT GLYCOSYLATED HEMOGLOBIN (HGB A1C): Hemoglobin A1C: 5.9 % — AB (ref 4.0–5.6)

## 2018-08-27 LAB — LIPID PANEL
Cholesterol: 187 mg/dL (ref 0–200)
HDL: 36.8 mg/dL — ABNORMAL LOW (ref >39.00)
NonHDL: 150.68
Total CHOL/HDL Ratio: 5
Triglycerides: 223 mg/dL — ABNORMAL HIGH (ref 0.0–149.0)
VLDL: 44.6 mg/dL — ABNORMAL HIGH (ref 0.0–40.0)

## 2018-08-27 LAB — LDL CHOLESTEROL, DIRECT: Direct LDL: 120 mg/dL

## 2018-08-27 LAB — TSH: TSH: 1.1 u[IU]/mL (ref 0.35–4.50)

## 2018-08-27 MED ORDER — PIOGLITAZONE HCL 45 MG PO TABS: 45.0000 mg | ORAL_TABLET | Freq: Every day | ORAL | 3 refills | Status: DC

## 2018-08-27 NOTE — Patient Instructions (Addendum)
check your blood sugar once a day.  vary the time of day when you check, between before the 3 meals, and at bedtime.  also check if you have symptoms of your blood sugar being too high or too low.  please keep a record of the readings and bring it to your next appointment here (or you can bring the meter itself).  You can write it on any piece of paper.  please call us sooner if your blood sugar goes below 70, or if you have a lot of readings over 200. Please continue the same medications.   good diet and exercise significantly improve the control of your diabetes.  please let me know if you wish to be referred to a dietician.   Please come back for a follow-up appointment in 4-5 months, fasting again.        Diabetes Mellitus and Nutrition When you have diabetes (diabetes mellitus), it is very important to have healthy eating habits because your blood sugar (glucose) levels are greatly affected by what you eat and drink. Eating healthy foods in the appropriate amounts, at about the same times every day, can help you:  Control your blood glucose.  Lower your risk of heart disease.  Improve your blood pressure.  Reach or maintain a healthy weight.  Every person with diabetes is different, and each person has different needs for a meal plan. Your health care provider may recommend that you work with a diet and nutrition specialist (dietitian) to make a meal plan that is best for you. Your meal plan may vary depending on factors such as:  The calories you need.  The medicines you take.  Your weight.  Your blood glucose, blood pressure, and cholesterol levels.  Your activity level.  Other health conditions you have, such as heart or kidney disease.  How do carbohydrates affect me? Carbohydrates affect your blood glucose level more than any other type of food. Eating carbohydrates naturally increases the amount of glucose in your blood. Carbohydrate counting is a method for keeping  track of how many carbohydrates you eat. Counting carbohydrates is important to keep your blood glucose at a healthy level, especially if you use insulin or take certain oral diabetes medicines. It is important to know how many carbohydrates you can safely have in each meal. This is different for every person. Your dietitian can help you calculate how many carbohydrates you should have at each meal and for snack. Foods that contain carbohydrates include:  Bread, cereal, rice, pasta, and crackers.  Potatoes and corn.  Peas, beans, and lentils.  Milk and yogurt.  Fruit and juice.  Desserts, such as cakes, cookies, ice cream, and candy.  How does alcohol affect me? Alcohol can cause a sudden decrease in blood glucose (hypoglycemia), especially if you use insulin or take certain oral diabetes medicines. Hypoglycemia can be a life-threatening condition. Symptoms of hypoglycemia (sleepiness, dizziness, and confusion) are similar to symptoms of having too much alcohol. If your health care provider says that alcohol is safe for you, follow these guidelines:  Limit alcohol intake to no more than 1 drink per day for nonpregnant women and 2 drinks per day for men. One drink equals 12 oz of beer, 5 oz of wine, or 1 oz of hard liquor.  Do not drink on an empty stomach.  Keep yourself hydrated with water, diet soda, or unsweetened iced tea.  Keep in mind that regular soda, juice, and other mixers may contain a lot  of sugar and must be counted as carbohydrates.  What are tips for following this plan? Reading food labels  Start by checking the serving size on the label. The amount of calories, carbohydrates, fats, and other nutrients listed on the label are based on one serving of the food. Many foods contain more than one serving per package.  Check the total grams (g) of carbohydrates in one serving. You can calculate the number of servings of carbohydrates in one serving by dividing the total  carbohydrates by 15. For example, if a food has 30 g of total carbohydrates, it would be equal to 2 servings of carbohydrates.  Check the number of grams (g) of saturated and trans fats in one serving. Choose foods that have low or no amount of these fats.  Check the number of milligrams (mg) of sodium in one serving. Most people should limit total sodium intake to less than 2,300 mg per day.  Always check the nutrition information of foods labeled as "low-fat" or "nonfat". These foods may be higher in added sugar or refined carbohydrates and should be avoided.  Talk to your dietitian to identify your daily goals for nutrients listed on the label. Shopping  Avoid buying canned, premade, or processed foods. These foods tend to be high in fat, sodium, and added sugar.  Shop around the outside edge of the grocery store. This includes fresh fruits and vegetables, bulk grains, fresh meats, and fresh dairy. Cooking  Use low-heat cooking methods, such as baking, instead of high-heat cooking methods like deep frying.  Cook using healthy oils, such as olive, canola, or sunflower oil.  Avoid cooking with butter, cream, or high-fat meats. Meal planning  Eat meals and snacks regularly, preferably at the same times every day. Avoid going long periods of time without eating.  Eat foods high in fiber, such as fresh fruits, vegetables, beans, and whole grains. Talk to your dietitian about how many servings of carbohydrates you can eat at each meal.  Eat 4-6 ounces of lean protein each day, such as lean meat, chicken, fish, eggs, or tofu. 1 ounce is equal to 1 ounce of meat, chicken, or fish, 1 egg, or 1/4 cup of tofu.  Eat some foods each day that contain healthy fats, such as avocado, nuts, seeds, and fish. Lifestyle   Check your blood glucose regularly.  Exercise at least 30 minutes 5 or more days each week, or as told by your health care provider.  Take medicines as told by your health care  provider.  Do not use any products that contain nicotine or tobacco, such as cigarettes and e-cigarettes. If you need help quitting, ask your health care provider.  Work with a Veterinary surgeon or diabetes educator to identify strategies to manage stress and any emotional and social challenges. What are some questions to ask my health care provider?  Do I need to meet with a diabetes educator?  Do I need to meet with a dietitian?  What number can I call if I have questions?  When are the best times to check my blood glucose? Where to find more information:  American Diabetes Association: diabetes.org/food-and-fitness/food  Academy of Nutrition and Dietetics: https://www.vargas.com/  [employer] of Diabetes and Digestive and Kidney Diseases (NIH): FindJewelers.cz Summary  A healthy meal plan will help you control your blood glucose and maintain a healthy lifestyle.  Working with a diet and nutrition specialist (dietitian) can help you make a meal plan that is best for you.  Keep in mind that carbohydrates and alcohol have immediate effects on your blood glucose levels. It is important to count carbohydrates and to use alcohol carefully. This information is not intended to replace advice given to you by your health care provider. Make sure you discuss any questions you have with your health care provider. Document Released: 09/07/2005 Document Revised: 01/15/2017 Document Reviewed: 01/15/2017 Elsevier Interactive Patient Education  [employer].

## 2018-08-27 NOTE — Progress Notes (Signed)
Subjective:    Patient ID: Monica Todd, female    DOB: 1971-10-08, 47 y.o.   MRN: [id number]  HPI Pt returns for f/u of DM:  DM type: Insulin-requiring type 2.  Dx'ed: early 2019 Complications: none Therapy: 2 oral meds GDM: 2003 DKA: never.  Severe hypoglycemia: never Pancreatitis: never Pancreatic imaging: never Other: she took insulin for a few mos in 2019; she also has hypertriglyceridemia.  Interval history: pt says cbg's are approx 100.  pt states she feels well in general.  She has quit smoking.   Past Medical History:  Diagnosis Date  . Asthma   . Bipolar 1 disorder (HCC)   . Depression   . Eczema   . Environmental allergies   . High cholesterol     Past Surgical History:  Procedure Laterality Date  . CESAREAN SECTION    . FOOT SURGERY    . HEMORRHOID SURGERY    . THERAPEUTIC ABORTION      Social History   Socioeconomic History  . Marital status: Married    Spouse name: Not on file  . Number of children: Not on file  . Years of education: Not on file  . Highest education level: Not on file  Occupational History  . Not on file  Social Needs  . Financial resource strain: Not on file  . Food insecurity:    Worry: Not on file    Inability: Not on file  . Transportation needs:    Medical: Not on file    Non-medical: Not on file  Tobacco Use  . Smoking status: Former Smoker    Packs/day: 1.00    Types: Cigarettes    Last attempt to quit: 07/16/2018    Years since quitting: 0.1  . Smokeless tobacco: Never Used  Substance and Sexual Activity  . Alcohol use: Yes    Comment: rare  . Drug use: No  . Sexual activity: Yes    Birth control/protection: None  Lifestyle  . Physical activity:    Days per week: Not on file    Minutes per session: Not on file  . Stress: Not on file  Relationships  . Social connections:    Talks on phone: Not on file    Gets together: Not on file    Attends religious service: Not on file    Active member of club or  organization: Not on file    Attends meetings of clubs or organizations: Not on file    Relationship status: Not on file  . Intimate partner violence:    Fear of current or ex partner: Not on file    Emotionally abused: Not on file    Physically abused: Not on file    Forced sexual activity: Not on file  Other Topics Concern  . Not on file  Social History Narrative  . Not on file    Current Outpatient Medications on File Prior to Visit  Medication Sig Dispense Refill  . budesonide-formoterol (SYMBICORT) 80-4.5 MCG/ACT inhaler Inhale 2 puffs into the lungs 2 (two) times daily.    . fluticasone (FLONASE) 50 MCG/ACT nasal spray fluticasone propionate 50 mcg/actuation nasal spray,suspension  INHALE 1 SPRAY (50 MCG) IN EACH NOSTRIL BY INTRANASAL ROUTE ONCE DAILY FOR 30 DAYS    . Fluticasone Furoate-Vilanterol (BREO ELLIPTA IN) Inhale into the lungs.    Marland Kitchen glucose blood (CONTOUR NEXT TEST) test strip 1 each by Other route 2 (two) times daily. And lancets 2/day 100 each 12  .  hydrocortisone cream 1 % Apply topically 2 (two) times daily.    Marland Kitchen ibuprofen (ADVIL,MOTRIN) 600 MG tablet Take 600 mg by mouth every 6 (six) hours as needed.    Marland Kitchen ipratropium-albuterol (DUONEB) 0.5-2.5 (3) MG/3ML SOLN ipratropium-albuterol 0.5 mg-3 mg(2.5 mg base)/3 mL nebulization soln  INHALE 1 VIAL EVERY 6 HOURS AS NEEDED FOR WHEEZING    . levocetirizine (XYZAL) 5 MG tablet Take 5 mg by mouth every evening.    Marland Kitchen lisinopril (PRINIVIL,ZESTRIL) 5 MG tablet Take 5 mg by mouth daily.  0  . metFORMIN (GLUCOPHAGE-XR) 500 MG 24 hr tablet Take 1,000 mg by mouth daily.  0  . neomycin-polymyxin-pramoxine (NEOSPORIN PLUS) 1 % cream Apply topically 2 (two) times daily.    . varenicline (CHANTIX) 1 MG tablet Take 1 mg by mouth 2 (two) times daily.    Marland Kitchen albuterol (PROVENTIL) (2.5 MG/3ML) 0.083% nebulizer solution albuterol sulfate 2.5 mg/3 mL (0.083 %) solution for nebulization  INHALE 1 VIAL EVERY 4-6 HOURS AS NEEDED FOR WHEEZING      . B-D ULTRAFINE III SHORT PEN 31G X 8 MM MISC USE WITH LANTUS EVERY AM (Patient not taking: Reported on 08/27/2018) 100 each 0  . beclomethasone (QVAR) 80 MCG/ACT inhaler Inhale 1 puff into the lungs as needed. 1 Inhaler 1   No current facility-administered medications on file prior to visit.     Allergies  Allergen Reactions  . Penicillins   . Sulfa Antibiotics   . Keflex [Cephalexin] Rash    Family History  Problem Relation Age of Onset  . Diabetes Other   . Cancer Other   . Coronary artery disease Other   . Asthma Other   . Diabetes Father     BP 110/62 (BP Location: Right Arm)   Pulse (!) 59   Ht 5\' 4"  (1.626 m)   Wt 178 lb 6.4 oz (80.9 kg)   SpO2 96%   BMI 30.62 kg/m    Review of Systems She has gained weight.      Objective:   Physical Exam VITAL SIGNS:  See vs page GENERAL: no distress Pulses: foot pulses are intact bilaterally.   MSK: no deformity of the feet or ankles.  CV: no edema of the legs or ankles Skin:  no ulcer on the feet or ankles.  normal color and temp on the feet and ankles Neuro: sensation is intact to touch on the feet and ankles.   Ext: There is bilateral onychomycosis of the toenails.   A1c=5.9%     Assessment & Plan:  Type 2 DM: well-controlled. Weight gain, worse: I advised diet--see below.  Hypertriglyceridemia: recheck today   Patient Instructions  check your blood sugar once a day.  vary the time of day when you check, between before the 3 meals, and at bedtime.  also check if you have symptoms of your blood sugar being too high or too low.  please keep a record of the readings and bring it to your next appointment here (or you can bring the meter itself).  You can write it on any piece of paper.  please call us sooner if your blood sugar goes below 70, or if you have a lot of readings over 200. Please continue the same medications.   good diet and exercise significantly improve the control of your diabetes.  please let me know  if you wish to be referred to a dietician.   Please come back for a follow-up appointment in 4-5 months, fasting again.  Diabetes Mellitus and Nutrition When you have diabetes (diabetes mellitus), it is very important to have healthy eating habits because your blood sugar (glucose) levels are greatly affected by what you eat and drink. Eating healthy foods in the appropriate amounts, at about the same times every day, can help you:  Control your blood glucose.  Lower your risk of heart disease.  Improve your blood pressure.  Reach or maintain a healthy weight.  Every person with diabetes is different, and each person has different needs for a meal plan. Your health care provider may recommend that you work with a diet and nutrition specialist (dietitian) to make a meal plan that is best for you. Your meal plan may vary depending on factors such as:  The calories you need.  The medicines you take.  Your weight.  Your blood glucose, blood pressure, and cholesterol levels.  Your activity level.  Other health conditions you have, such as heart or kidney disease.  How do carbohydrates affect me? Carbohydrates affect your blood glucose level more than any other type of food. Eating carbohydrates naturally increases the amount of glucose in your blood. Carbohydrate counting is a method for keeping track of how many carbohydrates you eat. Counting carbohydrates is important to keep your blood glucose at a healthy level, especially if you use insulin or take certain oral diabetes medicines. It is important to know how many carbohydrates you can safely have in each meal. This is different for every person. Your dietitian can help you calculate how many carbohydrates you should have at each meal and for snack. Foods that contain carbohydrates include:  Bread, cereal, rice, pasta, and crackers.  Potatoes and corn.  Peas, beans, and lentils.  Milk and yogurt.  Fruit and  juice.  Desserts, such as cakes, cookies, ice cream, and candy.  How does alcohol affect me? Alcohol can cause a sudden decrease in blood glucose (hypoglycemia), especially if you use insulin or take certain oral diabetes medicines. Hypoglycemia can be a life-threatening condition. Symptoms of hypoglycemia (sleepiness, dizziness, and confusion) are similar to symptoms of having too much alcohol. If your health care provider says that alcohol is safe for you, follow these guidelines:  Limit alcohol intake to no more than 1 drink per day for nonpregnant women and 2 drinks per day for men. One drink equals 12 oz of beer, 5 oz of wine, or 1 oz of hard liquor.  Do not drink on an empty stomach.  Keep yourself hydrated with water, diet soda, or unsweetened iced tea.  Keep in mind that regular soda, juice, and other mixers may contain a lot of sugar and must be counted as carbohydrates.  What are tips for following this plan? Reading food labels  Start by checking the serving size on the label. The amount of calories, carbohydrates, fats, and other nutrients listed on the label are based on one serving of the food. Many foods contain more than one serving per package.  Check the total grams (g) of carbohydrates in one serving. You can calculate the number of servings of carbohydrates in one serving by dividing the total carbohydrates by 15. For example, if a food has 30 g of total carbohydrates, it would be equal to 2 servings of carbohydrates.  Check the number of grams (g) of saturated and trans fats in one serving. Choose foods that have low or no amount of these fats.  Check the number of milligrams (mg) of sodium in one serving. Most people  should limit total sodium intake to less than 2,300 mg per day.  Always check the nutrition information of foods labeled as "low-fat" or "nonfat". These foods may be higher in added sugar or refined carbohydrates and should be avoided.  Talk to your  dietitian to identify your daily goals for nutrients listed on the label. Shopping  Avoid buying canned, premade, or processed foods. These foods tend to be high in fat, sodium, and added sugar.  Shop around the outside edge of the grocery store. This includes fresh fruits and vegetables, bulk grains, fresh meats, and fresh dairy. Cooking  Use low-heat cooking methods, such as baking, instead of high-heat cooking methods like deep frying.  Cook using healthy oils, such as olive, canola, or sunflower oil.  Avoid cooking with butter, cream, or high-fat meats. Meal planning  Eat meals and snacks regularly, preferably at the same times every day. Avoid going long periods of time without eating.  Eat foods high in fiber, such as fresh fruits, vegetables, beans, and whole grains. Talk to your dietitian about how many servings of carbohydrates you can eat at each meal.  Eat 4-6 ounces of lean protein each day, such as lean meat, chicken, fish, eggs, or tofu. 1 ounce is equal to 1 ounce of meat, chicken, or fish, 1 egg, or 1/4 cup of tofu.  Eat some foods each day that contain healthy fats, such as avocado, nuts, seeds, and fish. Lifestyle   Check your blood glucose regularly.  Exercise at least 30 minutes 5 or more days each week, or as told by your health care provider.  Take medicines as told by your health care provider.  Do not use any products that contain nicotine or tobacco, such as cigarettes and e-cigarettes. If you need help quitting, ask your health care provider.  Work with a Veterinary surgeon or diabetes educator to identify strategies to manage stress and any emotional and social challenges. What are some questions to ask my health care provider?  Do I need to meet with a diabetes educator?  Do I need to meet with a dietitian?  What number can I call if I have questions?  When are the best times to check my blood glucose? Where to find more information:  American Diabetes  Association: diabetes.org/food-and-fitness/food  Academy of Nutrition and Dietetics: https://www.vargas.com/  [employer] of Diabetes and Digestive and Kidney Diseases (NIH): FindJewelers.cz Summary  A healthy meal plan will help you control your blood glucose and maintain a healthy lifestyle.  Working with a diet and nutrition specialist (dietitian) can help you make a meal plan that is best for you.  Keep in mind that carbohydrates and alcohol have immediate effects on your blood glucose levels. It is important to count carbohydrates and to use alcohol carefully. This information is not intended to replace advice given to you by your health care provider. Make sure you discuss any questions you have with your health care provider. Document Released: 09/07/2005 Document Revised: 01/15/2017 Document Reviewed: 01/15/2017 Elsevier Interactive Patient Education  [employer].

## 2018-09-03 ENCOUNTER — Other Ambulatory Visit: Payer: Self-pay | Admitting: Endocrinology

## 2018-09-03 MED ORDER — FENOFIBRATE 40 MG PO TABS: 1.0000 | ORAL_TABLET | Freq: Every day | ORAL | 3 refills | Status: DC

## 2018-09-06 DIAGNOSIS — E119 Type 2 diabetes mellitus without complications: Secondary | ICD-10-CM | POA: Diagnosis not present

## 2018-09-06 DIAGNOSIS — Z6830 Body mass index (BMI) 30.0-30.9, adult: Secondary | ICD-10-CM | POA: Diagnosis not present

## 2018-09-06 DIAGNOSIS — J449 Chronic obstructive pulmonary disease, unspecified: Secondary | ICD-10-CM | POA: Diagnosis not present

## 2018-12-16 DIAGNOSIS — L301 Dyshidrosis [pompholyx]: Secondary | ICD-10-CM | POA: Diagnosis not present

## 2018-12-16 DIAGNOSIS — E119 Type 2 diabetes mellitus without complications: Secondary | ICD-10-CM | POA: Diagnosis not present

## 2018-12-16 DIAGNOSIS — J449 Chronic obstructive pulmonary disease, unspecified: Secondary | ICD-10-CM | POA: Diagnosis not present

## 2018-12-16 DIAGNOSIS — F329 Major depressive disorder, single episode, unspecified: Secondary | ICD-10-CM | POA: Diagnosis not present

## 2019-01-06 ENCOUNTER — Encounter: Payer: Self-pay | Admitting: Endocrinology

## 2019-01-06 ENCOUNTER — Ambulatory Visit (INDEPENDENT_AMBULATORY_CARE_PROVIDER_SITE_OTHER): Payer: BLUE CROSS/BLUE SHIELD | Admitting: Endocrinology

## 2019-01-06 VITALS — BP 118/68 | HR 69 | Ht 65.0 in | Wt 211.0 lb

## 2019-01-06 DIAGNOSIS — E119 Type 2 diabetes mellitus without complications: Secondary | ICD-10-CM

## 2019-01-06 DIAGNOSIS — Z794 Long term (current) use of insulin: Secondary | ICD-10-CM

## 2019-01-06 LAB — LIPID PANEL
Cholesterol: 216 mg/dL — ABNORMAL HIGH (ref 0–200)
HDL: 46.2 mg/dL (ref >39.00)
LDL Cholesterol: 138 mg/dL — ABNORMAL HIGH (ref 0–99)
NonHDL: 169.46
Total CHOL/HDL Ratio: 5
Triglycerides: 158 mg/dL — ABNORMAL HIGH (ref 0.0–149.0)
VLDL: 31.6 mg/dL (ref 0.0–40.0)

## 2019-01-06 LAB — POCT GLYCOSYLATED HEMOGLOBIN (HGB A1C): Hemoglobin A1C: 5.9 % — AB (ref 4.0–5.6)

## 2019-01-06 MED ORDER — ROSUVASTATIN CALCIUM 5 MG PO TABS: 5.0000 mg | ORAL_TABLET | Freq: Every day | ORAL | 3 refills | Status: DC

## 2019-01-06 NOTE — Patient Instructions (Addendum)
check your blood sugar once a day.  vary the time of day when you check, between before the 3 meals, and at bedtime.  also check if you have symptoms of your blood sugar being too high or too low.  please keep a record of the readings and bring it to your next appointment here (or you can bring the meter itself).  You can write it on any piece of paper.  please call us sooner if your blood sugar goes below 70, or if you have a lot of readings over 200. Please continue the same medications.   Please see a dietician.  We are getting you an appointment.  Based on the results of your blood tests today, you may need a tiny amount of "Crestor."   Please come back for a follow-up appointment in 4-5 months, fasting again.

## 2019-01-06 NOTE — Progress Notes (Signed)
Subjective:    Patient ID: Monica Todd, female    DOB: May 12, 1971, 48 y.o.   MRN: [id number]  HPI Pt returns for f/u of DM:  DM type: 2  Dx'ed: early 2019 Complications: none Therapy: 2 oral meds.  GDM: 2003 DKA: never.  Severe hypoglycemia: never.  Pancreatitis: never.  Pancreatic imaging: never.  Other: she took insulin for a few mos in 2019; she also has hypertriglyceridemia.   Interval history: pt says cbg's are approx 100.  pt states she feels well in general, except for weight gain.  She has quit smoking.   Past Medical History:  Diagnosis Date  . Asthma   . Bipolar 1 disorder (HCC)   . Depression   . Eczema   . Environmental allergies   . High cholesterol     Past Surgical History:  Procedure Laterality Date  . CESAREAN SECTION    . FOOT SURGERY    . HEMORRHOID SURGERY    . THERAPEUTIC ABORTION      Social History   Socioeconomic History  . Marital status: Married    Spouse name: Not on file  . Number of children: Not on file  . Years of education: Not on file  . Highest education level: Not on file  Occupational History  . Not on file  Social Needs  . Financial resource strain: Not on file  . Food insecurity:    Worry: Not on file    Inability: Not on file  . Transportation needs:    Medical: Not on file    Non-medical: Not on file  Tobacco Use  . Smoking status: Former Smoker    Packs/day: 1.00    Types: Cigarettes    Last attempt to quit: 07/16/2018    Years since quitting: 0.4  . Smokeless tobacco: Never Used  Substance and Sexual Activity  . Alcohol use: Yes    Comment: rare  . Drug use: No  . Sexual activity: Yes    Birth control/protection: None  Lifestyle  . Physical activity:    Days per week: Not on file    Minutes per session: Not on file  . Stress: Not on file  Relationships  . Social connections:    Talks on phone: Not on file    Gets together: Not on file    Attends religious service: Not on file    Active member of  club or organization: Not on file    Attends meetings of clubs or organizations: Not on file    Relationship status: Not on file  . Intimate partner violence:    Fear of current or ex partner: Not on file    Emotionally abused: Not on file    Physically abused: Not on file    Forced sexual activity: Not on file  Other Topics Concern  . Not on file  Social History Narrative  . Not on file    Current Outpatient Medications on File Prior to Visit  Medication Sig Dispense Refill  . buPROPion (WELLBUTRIN XL) 150 MG 24 hr tablet Take 150 mg by mouth daily.    . Fenofibrate 40 MG TABS Take 1 tablet (40 mg total) by mouth daily. 90 each 3  . fluticasone (FLONASE) 50 MCG/ACT nasal spray fluticasone propionate 50 mcg/actuation nasal spray,suspension  INHALE 1 SPRAY (50 MCG) IN EACH NOSTRIL BY INTRANASAL ROUTE ONCE DAILY FOR 30 DAYS    . Fluticasone Furoate-Vilanterol (BREO ELLIPTA IN) Inhale into the lungs.    Marland Kitchen  glucose blood (CONTOUR NEXT TEST) test strip 1 each by Other route 2 (two) times daily. And lancets 2/day (Patient taking differently: 1 each by Other route once a week. And lancets 2/day) 100 each 12  . hydrocortisone cream 1 % Apply topically 2 (two) times daily.    Marland Kitchen. ibuprofen (ADVIL,MOTRIN) 600 MG tablet Take 600 mg by mouth every 6 (six) hours as needed.    Marland Kitchen. ipratropium-albuterol (DUONEB) 0.5-2.5 (3) MG/3ML SOLN ipratropium-albuterol 0.5 mg-3 mg(2.5 mg base)/3 mL nebulization soln  INHALE 1 VIAL EVERY 6 HOURS AS NEEDED FOR WHEEZING    . levocetirizine (XYZAL) 5 MG tablet Take 5 mg by mouth every evening.    Marland Kitchen. lisinopril (PRINIVIL,ZESTRIL) 5 MG tablet Take 5 mg by mouth daily.  0  . metFORMIN (GLUCOPHAGE-XR) 500 MG 24 hr tablet Take 1,000 mg by mouth daily.  0  . neomycin-polymyxin-pramoxine (NEOSPORIN PLUS) 1 % cream Apply topically 2 (two) times daily.    . pioglitazone (ACTOS) 45 MG tablet Take 1 tablet (45 mg total) by mouth daily. 90 tablet 3  . albuterol (PROVENTIL) (2.5  MG/3ML) 0.083% nebulizer solution albuterol sulfate 2.5 mg/3 mL (0.083 %) solution for nebulization  INHALE 1 VIAL EVERY 4-6 HOURS AS NEEDED FOR WHEEZING    . B-D ULTRAFINE III SHORT PEN 31G X 8 MM MISC USE WITH LANTUS EVERY AM (Patient not taking: Reported on 08/27/2018) 100 each 0  . beclomethasone (QVAR) 80 MCG/ACT inhaler Inhale 1 puff into the lungs as needed. 1 Inhaler 1  . budesonide-formoterol (SYMBICORT) 80-4.5 MCG/ACT inhaler Inhale 2 puffs into the lungs 2 (two) times daily.    . varenicline (CHANTIX) 1 MG tablet Take 1 mg by mouth 2 (two) times daily.     No current facility-administered medications on file prior to visit.     Allergies  Allergen Reactions  . Penicillins   . Sulfa Antibiotics   . Keflex [Cephalexin] Rash    Family History  Problem Relation Age of Onset  . Diabetes Other   . Cancer Other   . Coronary artery disease Other   . Asthma Other   . Diabetes Father     BP 118/68 (BP Location: Left Arm, Patient Position: Sitting, Cuff Size: Normal)   Pulse 69   Ht 5\' 5"  (1.651 m)   Wt 211 lb (95.7 kg)   SpO2 98%   BMI 35.11 kg/m    Review of Systems Denies leg swelling.      Objective:   Physical Exam VITAL SIGNS:  See vs page GENERAL: no distress Pulses: foot pulses are intact bilaterally.   MSK: no deformity of the feet or ankles.  CV: no edema of the legs or ankles Skin:  no ulcer on the feet or ankles.  normal color and temp on the feet and ankles.  Neuro: sensation is intact to touch on the feet and ankles.   Ext: There is bilateral onychomycosis of the toenails.   Lab Results  Component Value Date   HGBA1C 5.9 (A) 01/06/2019   Lab Results  Component Value Date   CHOL 187 08/27/2018   HDL 36.80 (L) 08/27/2018   LDLCALC 137 (H) 03/25/2018   LDLDIRECT 120.0 08/27/2018   TRIG 223.0 (H) 08/27/2018   CHOLHDL 5 08/27/2018       Assessment & Plan:  Type 2 DM: well-controlled Obesity: worse Dyslipidemia: recheck today  Patient  Instructions  check your blood sugar once a day.  vary the time of day when you check, between  before the 3 meals, and at bedtime.  also check if you have symptoms of your blood sugar being too high or too low.  please keep a record of the readings and bring it to your next appointment here (or you can bring the meter itself).  You can write it on any piece of paper.  please call us sooner if your blood sugar goes below 70, or if you have a lot of readings over 200. Please continue the same medications.   Please see a dietician.  We are getting you an appointment.  Based on the results of your blood tests today, you may need a tiny amount of "Crestor."   Please come back for a follow-up appointment in 4-5 months, fasting again.

## 2019-01-16 DIAGNOSIS — E119 Type 2 diabetes mellitus without complications: Secondary | ICD-10-CM | POA: Diagnosis not present

## 2019-01-16 DIAGNOSIS — I10 Essential (primary) hypertension: Secondary | ICD-10-CM | POA: Diagnosis not present

## 2019-01-16 DIAGNOSIS — L301 Dyshidrosis [pompholyx]: Secondary | ICD-10-CM | POA: Diagnosis not present

## 2019-01-16 DIAGNOSIS — Z6834 Body mass index (BMI) 34.0-34.9, adult: Secondary | ICD-10-CM | POA: Diagnosis not present

## 2019-01-31 ENCOUNTER — Encounter: Payer: Self-pay | Admitting: Dietician

## 2019-01-31 ENCOUNTER — Encounter: Payer: BLUE CROSS/BLUE SHIELD | Attending: Endocrinology | Admitting: Dietician

## 2019-01-31 DIAGNOSIS — E119 Type 2 diabetes mellitus without complications: Secondary | ICD-10-CM | POA: Diagnosis not present

## 2019-01-31 DIAGNOSIS — Z794 Long term (current) use of insulin: Secondary | ICD-10-CM | POA: Diagnosis not present

## 2019-01-31 NOTE — Progress Notes (Signed)
Diabetes Self-Management Education  Visit Type: First/Initial  Appt. Start Time: 1300 Appt. End Time: 1430  01/31/2019  [name], identified by name and date of birth, is a 48 y.o. female with a diagnosis of Diabetes: Type 2.   ASSESSMENT History includes GDM 15 years ago, bipolar, depression, high cholesterol.  .    Weight today is 213 lbs.  She states that this is the heaviest that she has every been.  She quit smoking in July and her appetite has increase considerably.  She was started on Welbutrin to help with cravings.  She states that she is miserable being this heavy.  Her daughter also weighs about the same as her.  Her daughter has decided to be a vegetarian ("due to her Uric Acid" being high").  She states that when she was on insulin her numbers looked good and was rapidly dropping weight.   Lowest adult weight 126 lbs (20's). Comfort weight about 150 lbs. Weight in July was 165 lbs prior to quitting smoking. She gained 4-5 lbs per month. She stopped monitoring her sugar and stopped monitoring her steps often. She ate what she wished at that time. Weight 178 lbs in September  Medications include: Actos Metformin XR  Currently has has increased her steps to 10,000 steps per day and has stopped weight gain but has been unable to lose.  She is frustrated that her medication needs have increased with her weight gain.    She lives with her boyfriend, daughter, 5 dogs and 2 cats. Patient does the shopping and cooking. Her boyfriend is supportive about her weight and cooking changes. 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 Last year when she was diagnosed with diabetes she changed a lot about her lifestyle, increased her activity, and changed her job to one more local. Her daughter is now losing weight due to running (cutting out dark soda and eating less meat).  Height 5\' 5"  (1.651 m), weight 213 lb (96.6 kg). Body mass index is 35.45  kg/m.  Diabetes Self-Management Education - 01/31/19 1337      Visit Information   Visit Type  First/Initial      Initial Visit   Diabetes Type  Type 2    Are you currently following a meal plan?  No    Are you taking your medications as prescribed?  Yes    Date Diagnosed  12/2017      Health Coping   How would you rate your overall health?  Poor      Psychosocial Assessment   Patient Belief/Attitude about Diabetes  Other (comment)   aggrivated with weight gain   Self-care barriers  None    Self-management support  Family;Doctor's office    Other persons present  Patient    Patient Concerns  Nutrition/Meal planning    Special Needs  None    Preferred Learning Style  No preference indicated    Learning Readiness  Ready    How often do you need to have someone help you when you read instructions, pamphlets, or other written materials from your doctor or pharmacy?  1 - Never    What is the last grade level you completed in school?  BSBA      Pre-Education Assessment   Patient understands the diabetes disease and treatment process.  Needs Instruction    Patient understands incorporating nutritional management into lifestyle.  Needs Instruction    Patient undertands incorporating physical activity into lifestyle.  Needs  Instruction    Patient understands using medications safely.  Needs Instruction    Patient understands monitoring blood glucose, interpreting and using results  Needs Instruction    Patient understands prevention, detection, and treatment of acute complications.  Needs Instruction    Patient understands prevention, detection, and treatment of chronic complications.  Needs Instruction    Patient understands how to develop strategies to address psychosocial issues.  Needs Instruction    Patient understands how to develop strategies to promote health/change behavior.  Needs Instruction      Complications   Last HgB A1C per patient/outside source  5.9 %   01/06/19  stable but decreased from 12% when diagnosed   How often do you check your blood sugar?  1-2 times/day    Fasting Blood glucose range (mg/dL)  [id number]    Postprandial Blood glucose range (mg/dL)  [phone]    Number of hypoglycemic episodes per month  0    Number of hyperglycemic episodes per week  0    Have you had a dilated eye exam in the past 12 months?  No    Have you had a dental exam in the past 12 months?  No    Are you checking your feet?  Yes    How many days per week are you checking your feet?  7      Dietary Intake   Snack (morning)  Plain cheerios (without milk)    Lunch  (cafeteria at work- salad and cookie,occasional 1/2 sandwich OR pizza AND chocolate milk) OR [employer] [place] Protein Bar OR other cereal or granola bar    Snack (afternoon)  triscuits OR cheese-its, sunflower, roasted edemame    Dinner  spaghetti OR pizza OR suchi OR burgers or hotdogs OR roast, potatoes, carrots OR chicken, vegetables, raw vegetables OR grilled chicken salad from [employer] OR wrap from [employer]    Snack (evening)  crackers or chips (sometimes hungry and sometimes just eating)    Beverage(s)  black coffee (64 ounces daily), water (2 bottles), 2-3 regular soda per week (coke or gingerale)- dislikes diet, Chocolate milk 2 days per week, rare beer      Exercise   Exercise Type  Light (walking / raking leaves)    How many days per week to you exercise?  7    How many minutes per day do you exercise?  94    Total minutes per week of exercise  658      Patient Education   Previous Diabetes Education  Yes (please comment)   GDM 15 years ago   Nutrition management   Role of diet in the treatment of diabetes and the relationship between the three main macronutrients and blood glucose level;Food label reading, portion sizes and measuring food.;Meal options for control of blood glucose level and chronic complications.;Meal timing in regards to the patients' current diabetes medication.;Information on hints  to eating out and maintain blood glucose control.    Physical activity and exercise   Role of exercise on diabetes management, blood pressure control and cardiac health.    Medications  Reviewed patients medication for diabetes, action, purpose, timing of dose and side effects.    Monitoring  Purpose and frequency of SMBG.    Chronic complications  Relationship between chronic complications and blood glucose control;Retinopathy and reason for yearly dilated eye exams;Dental care    Psychosocial adjustment  Worked with patient to identify barriers to care and solutions;Role of stress on diabetes;Identified and addressed patients feelings  and concerns about diabetes    Personal strategies to promote health  Other (comment)   reviewed risk of smoking and encouraged continued cessation     Individualized Goals (developed by patient)   Nutrition  Follow meal plan discussed    Physical Activity  Exercise 5-7 days per week;45 minutes per day    Monitoring   test my blood glucose as discussed    Reducing Risk  Other (comment)   mindful eating   Health Coping  discuss diabetes with (comment)   MD, RD, CDE     Post-Education Assessment   Patient understands the diabetes disease and treatment process.  Needs Review    Patient understands incorporating nutritional management into lifestyle.  Needs Review    Patient undertands incorporating physical activity into lifestyle.  Demonstrates understanding / competency    Patient understands using medications safely.  Demonstrates understanding / competency    Patient understands monitoring blood glucose, interpreting and using results  Demonstrates understanding / competency    Patient understands prevention, detection, and treatment of acute complications.  Needs Review    Patient understands prevention, detection, and treatment of chronic complications.  Demonstrates understanding / competency    Patient understands how to develop strategies to address  psychosocial issues.  Needs Review    Patient understands how to develop strategies to promote health/change behavior.  Needs Review      Outcomes   Expected Outcomes  Demonstrated interest in learning. Expect positive outcomes    Future DMSE  4-6 wks    Program Status  Completed       Individualized Plan for Diabetes Self-Management Training:   Learning Objective:  Patient will have a greater understanding of diabetes self-management. Patient education plan is to attend individual and/or group sessions per assessed needs and concerns.   Plan:   Patient Instructions  Consider getting your vitamin D checked or take 1000-2000 units of vitamin D daily. Discuss your medication with your MD regarding ones that can cause weight gain and diabetes meds that can decrease your appetite.  Consider tracking your intake   My Fitness Pal app Consider Calorie Bro[employer] D[employer] app  Avoid skipping meals  Mindfulness  Choices  Before a snack, ask am I hungry or eating for another reason  Eat slowly and stop when you are satisfied   Aim for 2-3 Carb Choices per meal (30-45 grams) +/- 1 either way  Aim for 0-1 Carbs per snack if hungry  Include protein in moderation with your meals and snacks Consider reading food labels for Total Carbohydrate and Fat Grams of foods Consider checking BG at alternate times per day as directed by MD  Consider taking medication as directed by MD  Great job on the changes you have made!            Expected Outcomes:  Demonstrated interest in learning. Expect positive outcomes  Education material provided: ADA Diabetes: Your Take Control Guide, My Plate and Snack sheet  If problems or questions, patient to contact team via:  Phone  Future DSME appointment: 4-6 wks

## 2019-01-31 NOTE — Patient Instructions (Addendum)
Consider getting your vitamin D checked or take 1000-2000 units of vitamin D daily. Discuss your medication with your MD regarding ones that can cause weight gain and diabetes meds that can decrease your appetite.  Consider tracking your intake   My Fitness Pal app Consider Calorie [employer] app  Avoid skipping meals  Mindfulness  Choices  Before a snack, ask am I hungry or eating for another reason  Eat slowly and stop when you are satisfied   Aim for 2-3 Carb Choices per meal (30-45 grams) +/- 1 either way  Aim for 0-1 Carbs per snack if hungry  Include protein in moderation with your meals and snacks Consider reading food labels for Total Carbohydrate and Fat Grams of foods Consider checking BG at alternate times per day as directed by MD  Consider taking medication as directed by MD  Great job on the changes you have made!

## 2019-02-04 ENCOUNTER — Encounter: Payer: Self-pay | Admitting: Endocrinology

## 2019-02-04 NOTE — Telephone Encounter (Signed)
Please advise 

## 2019-02-17 DIAGNOSIS — Z6834 Body mass index (BMI) 34.0-34.9, adult: Secondary | ICD-10-CM | POA: Diagnosis not present

## 2019-02-17 DIAGNOSIS — Z7689 Persons encountering health services in other specified circumstances: Secondary | ICD-10-CM | POA: Diagnosis not present

## 2019-03-03 DIAGNOSIS — Z6834 Body mass index (BMI) 34.0-34.9, adult: Secondary | ICD-10-CM | POA: Diagnosis not present

## 2019-03-03 DIAGNOSIS — J019 Acute sinusitis, unspecified: Secondary | ICD-10-CM | POA: Diagnosis not present

## 2019-03-03 DIAGNOSIS — J309 Allergic rhinitis, unspecified: Secondary | ICD-10-CM | POA: Diagnosis not present

## 2019-03-03 DIAGNOSIS — H6981 Other specified disorders of Eustachian tube, right ear: Secondary | ICD-10-CM | POA: Diagnosis not present

## 2019-03-04 ENCOUNTER — Other Ambulatory Visit: Payer: Self-pay

## 2019-03-04 DIAGNOSIS — Z87891 Personal history of nicotine dependence: Secondary | ICD-10-CM | POA: Insufficient documentation

## 2019-03-04 DIAGNOSIS — Z7984 Long term (current) use of oral hypoglycemic drugs: Secondary | ICD-10-CM | POA: Diagnosis not present

## 2019-03-04 DIAGNOSIS — J45909 Unspecified asthma, uncomplicated: Secondary | ICD-10-CM | POA: Diagnosis not present

## 2019-03-04 DIAGNOSIS — Z79899 Other long term (current) drug therapy: Secondary | ICD-10-CM | POA: Diagnosis not present

## 2019-03-04 DIAGNOSIS — E1165 Type 2 diabetes mellitus with hyperglycemia: Secondary | ICD-10-CM | POA: Diagnosis not present

## 2019-03-04 DIAGNOSIS — E0965 Drug or chemical induced diabetes mellitus with hyperglycemia: Secondary | ICD-10-CM | POA: Diagnosis not present

## 2019-03-04 DIAGNOSIS — T380X5A Adverse effect of glucocorticoids and synthetic analogues, initial encounter: Secondary | ICD-10-CM | POA: Diagnosis not present

## 2019-03-05 ENCOUNTER — Encounter (HOSPITAL_COMMUNITY): Payer: Self-pay | Admitting: Emergency Medicine

## 2019-03-05 ENCOUNTER — Other Ambulatory Visit: Payer: Self-pay

## 2019-03-05 ENCOUNTER — Emergency Department (HOSPITAL_COMMUNITY)
Admission: EM | Admit: 2019-03-05 | Discharge: 2019-03-05 | Disposition: A | Discharge status: Home / Self Care | Payer: BLUE CROSS/BLUE SHIELD | Attending: Emergency Medicine | Admitting: Emergency Medicine

## 2019-03-05 DIAGNOSIS — T380X5A Adverse effect of glucocorticoids and synthetic analogues, initial encounter: Secondary | ICD-10-CM

## 2019-03-05 DIAGNOSIS — R739 Hyperglycemia, unspecified: Secondary | ICD-10-CM

## 2019-03-05 LAB — CBG MONITORING, ED: Glucose-Capillary: 244 mg/dL — ABNORMAL HIGH (ref 70–99)

## 2019-03-05 NOTE — ED Notes (Signed)
Bed: WTR8 Expected date:  Expected time:  Means of arrival:  Comments: 

## 2019-03-05 NOTE — ED Provider Notes (Signed)
TIME SEEN: 1:58 AM  CHIEF COMPLAINT: Hyperglycemia  HPI: Patient is a 48 year old female with history of diabetes on metformin who presents to the emergency department with hyperglycemia.  States that she checked her blood sugar tonight it was in the 400s.  Reports normally she is in the upper 100s, low 200s.  States she is on steroids for the past couple of days for URI.  Does have history of asthma.  States that she is on metformin only.  No longer on insulin.  She has no complaints currently.  Blood sugar checked here was 244.  She is requesting discharge home.  ROS: See HPI Constitutional: no fever  Eyes: no drainage  ENT: no runny nose   Cardiovascular:  no chest pain  Resp: no SOB  GI: no vomiting GU: no dysuria Integumentary: no rash  Allergy: no hives  Musculoskeletal: no leg swelling  Neurological: no slurred speech ROS otherwise negative  PAST MEDICAL HISTORY/PAST SURGICAL HISTORY:  Past Medical History:  Diagnosis Date  . Asthma   . Bipolar 1 disorder (HCC)   . Depression   . Eczema   . Environmental allergies   . High cholesterol     MEDICATIONS:  Prior to Admission medications   Medication Sig Start Date End Date Taking? Authorizing Provider  albuterol (PROVENTIL) (2.5 MG/3ML) 0.083% nebulizer solution albuterol sulfate 2.5 mg/3 mL (0.083 %) solution for nebulization  INHALE 1 VIAL EVERY 4-6 HOURS AS NEEDED FOR WHEEZING    [provider]  B-D ULTRAFINE III SHORT PEN 31G X 8 MM MISC USE WITH LANTUS EVERY AM Patient not taking: Reported on 08/27/2018 05/01/18   Romero Belling, MD  beclomethasone (QVAR) 80 MCG/ACT inhaler Inhale 1 puff into the lungs as needed. 08/25/12 08/25/13  Valarie Cones, Dema Severin, PA-C  budesonide-formoterol (SYMBICORT) 80-4.5 MCG/ACT inhaler Inhale 2 puffs into the lungs 2 (two) times daily.    [provider]  buPROPion (WELLBUTRIN XL) 150 MG 24 hr tablet Take 150 mg by mouth daily.    [provider]  clobetasol cream (TEMOVATE)  0.05 % Apply 1 application topically 2 (two) times daily.    [provider]  desvenlafaxine (PRISTIQ) 100 MG 24 hr tablet Take 100 mg by mouth daily.    [provider]  Fenofibrate 40 MG TABS Take 1 tablet (40 mg total) by mouth daily. 09/03/18   Romero Belling, MD  fluticasone (FLONASE) 50 MCG/ACT nasal spray fluticasone propionate 50 mcg/actuation nasal spray,suspension  INHALE 1 SPRAY (50 MCG) IN EACH NOSTRIL BY INTRANASAL ROUTE ONCE DAILY FOR 30 DAYS    [provider]  Fluticasone Furoate-Vilanterol (BREO ELLIPTA IN) Inhale into the lungs.    [provider]  glucose blood (CONTOUR NEXT TEST) test strip 1 each by Other route 2 (two) times daily. And lancets 2/day Patient taking differently: 1 each by Other route once a week. And lancets 2/day 01/24/18   Romero Belling, MD  hydrocortisone cream 1 % Apply topically 2 (two) times daily.    [provider]  ibuprofen (ADVIL,MOTRIN) 600 MG tablet Take 600 mg by mouth every 6 (six) hours as needed.    [provider]  ipratropium-albuterol (DUONEB) 0.5-2.5 (3) MG/3ML SOLN ipratropium-albuterol 0.5 mg-3 mg(2.5 mg base)/3 mL nebulization soln  INHALE 1 VIAL EVERY 6 HOURS AS NEEDED FOR WHEEZING    [provider]  levocetirizine (XYZAL) 5 MG tablet Take 5 mg by mouth every evening.    [provider]  lisinopril (PRINIVIL,ZESTRIL) 5 MG tablet  Take 5 mg by mouth daily. 01/14/18   [provider]  metFORMIN (GLUCOPHAGE-XR) 500 MG 24 hr tablet Take 1,000 mg by mouth daily. 01/16/18   [provider]  neomycin-polymyxin-pramoxine (NEOSPORIN PLUS) 1 % cream Apply topically 2 (two) times daily.    [provider]  pioglitazone (ACTOS) 45 MG tablet Take 1 tablet (45 mg total) by mouth daily. 08/27/18   Romero Belling, MD  rosuvastatin (CRESTOR) 5 MG tablet Take 1 tablet (5 mg total) by mouth daily. 01/06/19   Romero Belling, MD  varenicline (CHANTIX) 1 MG tablet Take 1  mg by mouth 2 (two) times daily.    [provider]    ALLERGIES:  Allergies  Allergen Reactions  . Penicillins   . Sulfa Antibiotics   . Keflex [Cephalexin] Rash    SOCIAL HISTORY:  Social History   Tobacco Use  . Smoking status: Former Smoker    Packs/day: 1.00    Types: Cigarettes    Last attempt to quit: 07/16/2018    Years since quitting: 0.6  . Smokeless tobacco: Never Used  Substance Use Topics  . Alcohol use: Yes    Comment: rare    FAMILY HISTORY: Family History  Problem Relation Age of Onset  . Diabetes Other   . Cancer Other   . Coronary artery disease Other   . Asthma Other   . Diabetes Father     EXAM: BP 138/76 (BP Location: Left Arm)   Pulse 65   Resp 15   Ht 5\' 5"  (1.651 m)   Wt 93.9 kg   LMP 06/25/2012   SpO2 95%   BMI 34.45 kg/m  CONSTITUTIONAL: Alert and oriented and responds appropriately to questions. Well-appearing; well-nourished HEAD: Normocephalic EYES: Conjunctivae clear, pupils appear equal, EOMI ENT: normal nose; moist mucous membranes NECK: Supple, no meningismus, no nuchal rigidity, no LAD  CARD: RRR; S1 and S2 appreciated; no murmurs, no clicks, no rubs, no gallops RESP: Normal chest excursion without splinting or tachypnea; breath sounds clear and equal bilaterally; no wheezes, no rhonchi, no rales, no hypoxia or respiratory distress, speaking full sentences ABD/GI: Normal bowel sounds; non-distended; soft, non-tender, no rebound, no guarding, no peritoneal signs, no hepatosplenomegaly BACK:  The back appears normal and is non-tender to palpation, there is no CVA tenderness EXT: Normal ROM in all joints; non-tender to palpation; no edema; normal capillary refill; no cyanosis, no calf tenderness or swelling    SKIN: Normal color for age and race; warm; no rash NEURO: Moves all extremities equally PSYCH: The patient's mood and manner are appropriate. Grooming and personal hygiene are appropriate.  MEDICAL DECISION  MAKING: Patient here with hyperglycemia that has resolved.  She has no acute complaints.  I doubt that she is in DKA.  She is extremely well-appearing without vomiting.  Patient does not want labs, urine at this time and would like discharge home which I feel is reasonable.  I suspect her hyperglycemia secondary to steroid use.  States she has 3 more days to take and she would like to finish them given they have helped her URI, asthma symptoms.  Have recommended she watch her blood sugar very closely, monitor her diet and continue her metformin as prescribed.  She is comfortable with this plan we have discussed at length return precautions.  At this time, I do not feel there is any life-threatening condition present. I have reviewed and discussed all results (EKG, imaging, lab, urine as appropriate) and exam findings with patient/family. I  have reviewed nursing notes and appropriate previous records.  I feel the patient is safe to be discharged home without further emergent workup and can continue workup as an outpatient as needed. Discussed usual and customary return precautions. Patient/family verbalize understanding and are comfortable with this plan.  Outpatient follow-up has been provided as needed. All questions have been answered.      Ward, Layla Maw, DO 03/05/19 [phone]

## 2019-03-05 NOTE — ED Triage Notes (Signed)
Patient states her blood sugar was 426. Patient blood sugar is 244.

## 2019-03-06 ENCOUNTER — Encounter: Payer: Self-pay | Admitting: Endocrinology

## 2019-03-06 NOTE — Telephone Encounter (Signed)
Please advise 

## 2019-03-07 ENCOUNTER — Ambulatory Visit: Payer: BLUE CROSS/BLUE SHIELD | Admitting: Dietician

## 2019-03-07 NOTE — Telephone Encounter (Signed)
Please refer to pt response 

## 2019-03-12 DIAGNOSIS — F331 Major depressive disorder, recurrent, moderate: Secondary | ICD-10-CM | POA: Diagnosis not present

## 2019-03-12 DIAGNOSIS — F340 Cyclothymic disorder: Secondary | ICD-10-CM | POA: Diagnosis not present

## 2019-03-18 DIAGNOSIS — I10 Essential (primary) hypertension: Secondary | ICD-10-CM | POA: Diagnosis not present

## 2019-03-18 DIAGNOSIS — H1013 Acute atopic conjunctivitis, bilateral: Secondary | ICD-10-CM | POA: Diagnosis not present

## 2019-03-18 DIAGNOSIS — E119 Type 2 diabetes mellitus without complications: Secondary | ICD-10-CM | POA: Diagnosis not present

## 2019-03-18 DIAGNOSIS — H6591 Unspecified nonsuppurative otitis media, right ear: Secondary | ICD-10-CM | POA: Diagnosis not present

## 2019-03-25 ENCOUNTER — Ambulatory Visit: Payer: BLUE CROSS/BLUE SHIELD | Admitting: Dietician

## 2019-03-28 DIAGNOSIS — E119 Type 2 diabetes mellitus without complications: Secondary | ICD-10-CM | POA: Diagnosis not present

## 2019-03-28 DIAGNOSIS — Z0000 Encounter for general adult medical examination without abnormal findings: Secondary | ICD-10-CM | POA: Diagnosis not present

## 2019-03-28 DIAGNOSIS — H6591 Unspecified nonsuppurative otitis media, right ear: Secondary | ICD-10-CM | POA: Diagnosis not present

## 2019-03-28 DIAGNOSIS — Z13220 Encounter for screening for lipoid disorders: Secondary | ICD-10-CM | POA: Diagnosis not present

## 2019-03-28 DIAGNOSIS — H1013 Acute atopic conjunctivitis, bilateral: Secondary | ICD-10-CM | POA: Diagnosis not present

## 2019-04-11 DIAGNOSIS — F4322 Adjustment disorder with anxiety: Secondary | ICD-10-CM | POA: Diagnosis not present

## 2019-04-11 DIAGNOSIS — E119 Type 2 diabetes mellitus without complications: Secondary | ICD-10-CM | POA: Diagnosis not present

## 2019-04-11 DIAGNOSIS — J449 Chronic obstructive pulmonary disease, unspecified: Secondary | ICD-10-CM | POA: Diagnosis not present

## 2019-04-11 DIAGNOSIS — Z6833 Body mass index (BMI) 33.0-33.9, adult: Secondary | ICD-10-CM | POA: Diagnosis not present

## 2019-05-07 ENCOUNTER — Encounter: Payer: Self-pay | Admitting: Endocrinology

## 2019-05-07 ENCOUNTER — Other Ambulatory Visit: Payer: Self-pay

## 2019-05-07 ENCOUNTER — Ambulatory Visit (INDEPENDENT_AMBULATORY_CARE_PROVIDER_SITE_OTHER): Payer: BLUE CROSS/BLUE SHIELD | Admitting: Endocrinology

## 2019-05-07 VITALS — BP 120/60 | HR 84 | Ht 65.0 in | Wt 194.6 lb

## 2019-05-07 DIAGNOSIS — E119 Type 2 diabetes mellitus without complications: Secondary | ICD-10-CM

## 2019-05-07 DIAGNOSIS — Z794 Long term (current) use of insulin: Secondary | ICD-10-CM

## 2019-05-07 LAB — POCT GLYCOSYLATED HEMOGLOBIN (HGB A1C): Hemoglobin A1C: 7 % — AB (ref 4.0–5.6)

## 2019-05-07 NOTE — Telephone Encounter (Signed)
Please review and advise.

## 2019-05-07 NOTE — Progress Notes (Signed)
Subjective:    Patient ID: Monica Todd, female    DOB: 1971-10-12, 48 y.o.   MRN: [id number]  HPI Pt returns for f/u of DM:  DM type: 2  Dx'ed: early 2019 Complications: none Therapy: 2 oral meds.  GDM: 2003 DKA: never.  Severe hypoglycemia: never.  Pancreatitis: never.  Pancreatic imaging: never.  Other: she took insulin for a few mos in 2019; she also has hypertriglyceridemia.   Interval history: She took a course of steroids recently, for URI.  she brings a record of her cbg's which I have reviewed today.  Since she is off the steroids, cbg is approx 100.   Past Medical History:  Diagnosis Date  . Asthma   . Bipolar 1 disorder (HCC)   . Depression   . Eczema   . Environmental allergies   . High cholesterol     Past Surgical History:  Procedure Laterality Date  . CESAREAN SECTION    . FOOT SURGERY    . HEMORRHOID SURGERY    . THERAPEUTIC ABORTION      Social History   Socioeconomic History  . Marital status: Married    Spouse name: Not on file  . Number of children: Not on file  . Years of education: Not on file  . Highest education level: Not on file  Occupational History  . Not on file  Social Needs  . Financial resource strain: Not on file  . Food insecurity:    Worry: Not on file    Inability: Not on file  . Transportation needs:    Medical: Not on file    Non-medical: Not on file  Tobacco Use  . Smoking status: Former Smoker    Packs/day: 1.00    Types: Cigarettes    Last attempt to quit: 07/16/2018    Years since quitting: 0.8  . Smokeless tobacco: Never Used  Substance and Sexual Activity  . Alcohol use: Yes    Comment: rare  . Drug use: No  . Sexual activity: Yes    Birth control/protection: None  Lifestyle  . Physical activity:    Days per week: Not on file    Minutes per session: Not on file  . Stress: Not on file  Relationships  . Social connections:    Talks on phone: Not on file    Gets together: Not on file    Attends  religious service: Not on file    Active member of club or organization: Not on file    Attends meetings of clubs or organizations: Not on file    Relationship status: Not on file  . Intimate partner violence:    Fear of current or ex partner: Not on file    Emotionally abused: Not on file    Physically abused: Not on file    Forced sexual activity: Not on file  Other Topics Concern  . Not on file  Social History Narrative  . Not on file    Current Outpatient Medications on File Prior to Visit  Medication Sig Dispense Refill  . albuterol (PROVENTIL) (2.5 MG/3ML) 0.083% nebulizer solution albuterol sulfate 2.5 mg/3 mL (0.083 %) solution for nebulization  INHALE 1 VIAL EVERY 4-6 HOURS AS NEEDED FOR WHEEZING    . B-D ULTRAFINE III SHORT PEN 31G X 8 MM MISC USE WITH LANTUS EVERY AM 100 each 0  . budesonide-formoterol (SYMBICORT) 80-4.5 MCG/ACT inhaler Inhale 2 puffs into the lungs 2 (two) times daily.    Marland Kitchen buPROPion (  WELLBUTRIN XL) 150 MG 24 hr tablet Take 150 mg by mouth daily.    . clobetasol cream (TEMOVATE) 0.05 % Apply 1 application topically 2 (two) times daily.    Marland Kitchen desvenlafaxine (PRISTIQ) 100 MG 24 hr tablet Take 100 mg by mouth daily.    . Fenofibrate 40 MG TABS Take 1 tablet (40 mg total) by mouth daily. 90 each 3  . fluticasone (FLONASE) 50 MCG/ACT nasal spray fluticasone propionate 50 mcg/actuation nasal spray,suspension  INHALE 1 SPRAY (50 MCG) IN EACH NOSTRIL BY INTRANASAL ROUTE ONCE DAILY FOR 30 DAYS    . Fluticasone Furoate-Vilanterol (BREO ELLIPTA IN) Inhale into the lungs.    Marland Kitchen glucose blood (CONTOUR NEXT TEST) test strip 1 each by Other route 2 (two) times daily. And lancets 2/day (Patient taking differently: 1 each by Other route once a week. And lancets 2/day) 100 each 12  . hydrocortisone cream 1 % Apply topically 2 (two) times daily.    Marland Kitchen ibuprofen (ADVIL,MOTRIN) 600 MG tablet Take 600 mg by mouth every 6 (six) hours as needed.    Marland Kitchen ipratropium-albuterol (DUONEB)  0.5-2.5 (3) MG/3ML SOLN ipratropium-albuterol 0.5 mg-3 mg(2.5 mg base)/3 mL nebulization soln  INHALE 1 VIAL EVERY 6 HOURS AS NEEDED FOR WHEEZING    . levocetirizine (XYZAL) 5 MG tablet Take 5 mg by mouth every evening.    Marland Kitchen lisinopril (PRINIVIL,ZESTRIL) 5 MG tablet Take 5 mg by mouth daily.  0  . metFORMIN (GLUCOPHAGE-XR) 500 MG 24 hr tablet Take 1,000 mg by mouth daily.  0  . neomycin-polymyxin-pramoxine (NEOSPORIN PLUS) 1 % cream Apply topically 2 (two) times daily.    . pioglitazone (ACTOS) 45 MG tablet Take 1 tablet (45 mg total) by mouth daily. 90 tablet 3  . rosuvastatin (CRESTOR) 5 MG tablet Take 1 tablet (5 mg total) by mouth daily. 90 tablet 3  . varenicline (CHANTIX) 1 MG tablet Take 1 mg by mouth 2 (two) times daily.    . beclomethasone (QVAR) 80 MCG/ACT inhaler Inhale 1 puff into the lungs as needed. 1 Inhaler 1   No current facility-administered medications on file prior to visit.     Allergies  Allergen Reactions  . Penicillins   . Sulfa Antibiotics   . Keflex [Cephalexin] Rash    Family History  Problem Relation Age of Onset  . Diabetes Other   . Cancer Other   . Coronary artery disease Other   . Asthma Other   . Diabetes Father     BP 120/60 (BP Location: Left Arm, Patient Position: Sitting, Cuff Size: Large)   Pulse 84   Ht  (1.651 m)   Wt 194 lb 9.6 oz (88.3 kg)   LMP 06/25/2012   SpO2 92%   BMI 32.38 kg/m    Review of Systems She denies hypoglycemia.      Objective:   Physical Exam VITAL SIGNS:  See vs page GENERAL: no distress Pulses: dorsalis pedis intact bilat.   MSK: no deformity of the feet CV: no leg edema Skin:  no ulcer on the feet.  normal color and temp on the feet. Neuro: sensation is intact to touch on the feet Ext: There is bilateral onychomycosis of the toenails.    Lab Results  Component Value Date   HGBA1C 7.0 (A) 05/07/2019       Assessment & Plan:  Type 2 DM: worse. URI: steroid rx is affecting a1c.  Dyslipidemia, on rx  Patient Instructions  check your blood sugar once a day.  vary the time of day when you check, between before the 3 meals, and at bedtime.  also check if you have symptoms of your blood sugar being too high or too low.  please keep a record of the readings and bring it to your next appointment here (or you can bring the meter itself).  You can write it on any piece of paper.  please call us sooner if your blood sugar goes below 70, or if you have a lot of readings over 200. Please continue the same medications.   Please send us the recent lipid blood test results.  We may need to add fish oil.   Please come back for a follow-up appointment in 4-6 months.

## 2019-05-07 NOTE — Patient Instructions (Addendum)
check your blood sugar once a day.  vary the time of day when you check, between before the 3 meals, and at bedtime.  also check if you have symptoms of your blood sugar being too high or too low.  please keep a record of the readings and bring it to your next appointment here (or you can bring the meter itself).  You can write it on any piece of paper.  please call us sooner if your blood sugar goes below 70, or if you have a lot of readings over 200. Please continue the same medications.   Please send [place] the recent lipid blood test results.  We may need to add fish oil.   Please come back for a follow-up appointment in 4-6 months.

## 2019-07-01 DIAGNOSIS — Z6831 Body mass index (BMI) 31.0-31.9, adult: Secondary | ICD-10-CM | POA: Diagnosis not present

## 2019-07-01 DIAGNOSIS — J441 Chronic obstructive pulmonary disease with (acute) exacerbation: Secondary | ICD-10-CM | POA: Diagnosis not present

## 2019-07-01 DIAGNOSIS — L301 Dyshidrosis [pompholyx]: Secondary | ICD-10-CM | POA: Diagnosis not present

## 2019-09-13 ENCOUNTER — Other Ambulatory Visit: Payer: Self-pay | Admitting: Endocrinology

## 2019-09-13 NOTE — Telephone Encounter (Signed)
Please forward refill request to pt's primary care provider.   

## 2019-10-01 DIAGNOSIS — J449 Chronic obstructive pulmonary disease, unspecified: Secondary | ICD-10-CM | POA: Diagnosis not present

## 2019-10-01 DIAGNOSIS — F4322 Adjustment disorder with anxiety: Secondary | ICD-10-CM | POA: Diagnosis not present

## 2019-10-01 DIAGNOSIS — E119 Type 2 diabetes mellitus without complications: Secondary | ICD-10-CM | POA: Diagnosis not present

## 2019-10-01 DIAGNOSIS — I10 Essential (primary) hypertension: Secondary | ICD-10-CM | POA: Diagnosis not present

## 2019-10-06 ENCOUNTER — Other Ambulatory Visit: Payer: Self-pay

## 2019-10-08 ENCOUNTER — Encounter: Payer: Self-pay | Admitting: Endocrinology

## 2019-10-08 ENCOUNTER — Ambulatory Visit: Payer: BLUE CROSS/BLUE SHIELD | Admitting: Endocrinology

## 2019-10-08 ENCOUNTER — Other Ambulatory Visit: Payer: Self-pay

## 2019-10-08 VITALS — BP 124/60 | HR 84 | Ht 65.0 in | Wt 181.6 lb

## 2019-10-08 DIAGNOSIS — Z794 Long term (current) use of insulin: Secondary | ICD-10-CM

## 2019-10-08 DIAGNOSIS — E119 Type 2 diabetes mellitus without complications: Secondary | ICD-10-CM

## 2019-10-08 LAB — POCT GLYCOSYLATED HEMOGLOBIN (HGB A1C): Hemoglobin A1C: 7.1 % — AB (ref 4.0–5.6)

## 2019-10-08 MED ORDER — ROSUVASTATIN CALCIUM 5 MG PO TABS: 5.0000 mg | ORAL_TABLET | Freq: Every day | ORAL | 3 refills | Status: DC

## 2019-10-08 MED ORDER — FENOFIBRATE 40 MG PO TABS: 1.0000 | ORAL_TABLET | Freq: Every day | ORAL | 3 refills | Status: DC

## 2019-10-08 MED ORDER — METFORMIN HCL 500 MG PO TABS: 1000.0000 mg | ORAL_TABLET | Freq: Two times a day (BID) | ORAL | 3 refills | Status: DC

## 2019-10-08 NOTE — Patient Instructions (Addendum)
check your blood sugar once a day.  vary the time of day when you check, between before the 3 meals, and at bedtime.  also check if you have symptoms of your blood sugar being too high or too low.  please keep a record of the readings and bring it to your next appointment here (or you can bring the meter itself).  You can write it on any piece of paper.  please call us sooner if your blood sugar goes below 70, or if you have a lot of readings over 200. I have sent a prescription to [facility], to increase the metformin to 2 pills per day, and for the fenofibrate.   Please come back for a follow-up appointment in 4 months.  We'll check the lipids next time.   Here is discount card for the prescription fish oil.  Let me know if this is cheaper.

## 2019-10-08 NOTE — Progress Notes (Signed)
Subjective:    Patient ID: Monica Todd, female    DOB: [birth year], 48 y.o.   MRN: [id number]  HPI Pt returns for f/u of DM:  DM type: 2  Dx'ed: early 5284 Complications: PN Therapy: 2 oral meds.  GDM: 2003 DKA: never.  Severe hypoglycemia: never.  Pancreatitis: never.  Pancreatic imaging: never.  Other: she took insulin for a few mos in 2019; she also has hypertriglyceridemia; she stopped pioglitazone, due to weight gain.   Interval history: no cbg record, but states cbg's are in the low to mid-100's.  No recent steroids.  She has not recently taken metformin, due to cost.  She ran out of fenofibrate.  She takes OTC fish oil.   Past Medical History:  Diagnosis Date  . Asthma   . Bipolar 1 disorder ([place])   . Depression   . Eczema   . Environmental allergies   . High cholesterol     Past Surgical History:  Procedure Laterality Date  . CESAREAN SECTION    . FOOT SURGERY    . HEMORRHOID SURGERY    . THERAPEUTIC ABORTION      Social History   Socioeconomic History  . Marital status: Married    Spouse name: Not on file  . Number of children: Not on file  . Years of education: Not on file  . Highest education level: Not on file  Occupational History  . Not on file  Social Needs  . Financial resource strain: Not on file  . Food insecurity    Worry: Not on file    Inability: Not on file  . Transportation needs    Medical: Not on file    Non-medical: Not on file  Tobacco Use  . Smoking status: Former Smoker    Packs/day: 1.00    Types: Cigarettes    Quit date: 07/16/2018    Years since quitting: 1.2  . Smokeless tobacco: Never Used  Substance and Sexual Activity  . Alcohol use: Yes    Comment: rare  . Drug use: No  . Sexual activity: Yes    Birth control/protection: None  Lifestyle  . Physical activity    Days per week: Not on file    Minutes per session: Not on file  . Stress: Not on file  Relationships  . Social Herbalist on phone: Not  on file    Gets together: Not on file    Attends religious service: Not on file    Active member of club or organization: Not on file    Attends meetings of clubs or organizations: Not on file    Relationship status: Not on file  . Intimate partner violence    Fear of current or ex partner: Not on file    Emotionally abused: Not on file    Physically abused: Not on file    Forced sexual activity: Not on file  Other Topics Concern  . Not on file  Social History Narrative  . Not on file    Current Outpatient Medications on File Prior to Visit  Medication Sig Dispense Refill  . albuterol (PROVENTIL) (2.5 MG/3ML) 0.083% nebulizer solution albuterol sulfate 2.5 mg/3 mL (0.083 %) solution for nebulization  INHALE 1 VIAL EVERY 4-6 HOURS AS NEEDED FOR WHEEZING    . B-D ULTRAFINE III SHORT PEN 31G X 8 MM MISC USE WITH LANTUS EVERY AM 100 each 0  . beclomethasone (QVAR) 80 MCG/ACT inhaler Inhale 1 puff into the  lungs as needed. 1 Inhaler 1  . budesonide-formoterol (SYMBICORT) 80-4.5 MCG/ACT inhaler Inhale 2 puffs into the lungs 2 (two) times daily.    Marland Kitchen. buPROPion (WELLBUTRIN XL) 150 MG 24 hr tablet Take 150 mg by mouth daily.    . clobetasol cream (TEMOVATE) 0.05 % Apply 1 application topically 2 (two) times daily.    Marland Kitchen. desvenlafaxine (PRISTIQ) 100 MG 24 hr tablet Take 100 mg by mouth daily.    . fluticasone (FLONASE) 50 MCG/ACT nasal spray fluticasone propionate 50 mcg/actuation nasal spray,suspension  INHALE 1 SPRAY (50 MCG) IN EACH NOSTRIL BY INTRANASAL ROUTE ONCE DAILY FOR 30 DAYS    . Fluticasone Furoate-Vilanterol (BREO ELLIPTA IN) Inhale into the lungs.    Marland Kitchen. glucose blood (CONTOUR NEXT TEST) test strip 1 each by Other route 2 (two) times daily. And lancets 2/day (Patient taking differently: 1 each by Other route once a week. And lancets 2/day) 100 each 12  . hydrocortisone cream 1 % Apply topically 2 (two) times daily.    Marland Kitchen. ibuprofen (ADVIL,MOTRIN) 600 MG tablet Take 600 mg by mouth  every 6 (six) hours as needed.    Marland Kitchen. ipratropium-albuterol (DUONEB) 0.5-2.5 (3) MG/3ML SOLN ipratropium-albuterol 0.5 mg-3 mg(2.5 mg base)/3 mL nebulization soln  INHALE 1 VIAL EVERY 6 HOURS AS NEEDED FOR WHEEZING    . levocetirizine (XYZAL) 5 MG tablet Take 5 mg by mouth every evening.    Marland Kitchen. lisinopril (PRINIVIL,ZESTRIL) 5 MG tablet Take 5 mg by mouth daily.  0  . neomycin-polymyxin-pramoxine (NEOSPORIN PLUS) 1 % cream Apply topically 2 (two) times daily.    . varenicline (CHANTIX) 1 MG tablet Take 1 mg by mouth 2 (two) times daily.     No current facility-administered medications on file prior to visit.     Allergies  Allergen Reactions  . Penicillins   . Sulfa Antibiotics   . Keflex [Cephalexin] Rash    Family History  Problem Relation Age of Onset  . Diabetes Other   . Cancer Other   . Coronary artery disease Other   . Asthma Other   . Diabetes Father     BP 124/60 (BP Location: Left Arm, Patient Position: Sitting, Cuff Size: Normal)   Pulse 84   Ht 5\' 5"  (1.651 m)   Wt 181 lb 9.6 oz (82.4 kg)   LMP 06/25/2012   SpO2 95%   BMI 30.22 kg/m    Review of Systems She denies hypoglycemia    Objective:   Physical Exam VITAL SIGNS:  See vs page GENERAL: no distress Pulses: dorsalis pedis intact bilat.   MSK: no deformity of the feet CV: no leg edema Skin:  no ulcer on the feet, but she skin is dry.  normal color and temp on the feet.   Neuro: sensation is intact to touch on the feet, but decreased from normal Ext: there is bilateral onychomycosis of the toenails.    Lab Results  Component Value Date   HGBA1C 7.1 (A) 10/08/2019       Assessment & Plan:  Type 2 DM, with PN: worse Hypertriglyceridemia: therapy limited by noncompliance.     Patient Instructions  check your blood sugar once a day.  vary the time of day when you check, between before the 3 meals, and at bedtime.  also check if you have symptoms of your blood sugar being too high or too low.  please  keep a record of the readings and bring it to your next appointment here (or you can  bring the meter itself).  You can write it on any piece of paper.  please call us sooner if your blood sugar goes below 70, or if you have a lot of readings over 200. I have sent a prescription to [facility], to increase the metformin to 2 pills per day, and for the fenofibrate.   Please come back for a follow-up appointment in 4 months.  We'll check the lipids next time.   Here is discount card for the prescription fish oil.  Let me know if this is cheaper.

## 2019-10-30 DIAGNOSIS — Z03818 Encounter for observation for suspected exposure to other biological agents ruled out: Secondary | ICD-10-CM | POA: Diagnosis not present

## 2020-01-28 DIAGNOSIS — I10 Essential (primary) hypertension: Secondary | ICD-10-CM | POA: Diagnosis not present

## 2020-01-28 DIAGNOSIS — J441 Chronic obstructive pulmonary disease with (acute) exacerbation: Secondary | ICD-10-CM | POA: Diagnosis not present

## 2020-01-28 DIAGNOSIS — E1165 Type 2 diabetes mellitus with hyperglycemia: Secondary | ICD-10-CM | POA: Diagnosis not present

## 2020-01-28 DIAGNOSIS — E118 Type 2 diabetes mellitus with unspecified complications: Secondary | ICD-10-CM | POA: Diagnosis not present

## 2020-02-03 ENCOUNTER — Encounter: Payer: Self-pay | Admitting: Endocrinology

## 2020-02-03 ENCOUNTER — Ambulatory Visit: Payer: BC Managed Care – PPO | Admitting: Endocrinology

## 2020-02-03 ENCOUNTER — Other Ambulatory Visit: Payer: Self-pay

## 2020-02-03 ENCOUNTER — Telehealth: Payer: Self-pay

## 2020-02-03 VITALS — BP 130/70 | HR 67 | Ht 65.0 in | Wt 180.0 lb

## 2020-02-03 DIAGNOSIS — E119 Type 2 diabetes mellitus without complications: Secondary | ICD-10-CM

## 2020-02-03 DIAGNOSIS — Z794 Long term (current) use of insulin: Secondary | ICD-10-CM | POA: Diagnosis not present

## 2020-02-03 LAB — POCT GLYCOSYLATED HEMOGLOBIN (HGB A1C): Hemoglobin A1C: 7.9 % — AB (ref 4.0–5.6)

## 2020-02-03 MED ORDER — RYBELSUS 3 MG PO TABS: 3.0000 mg | ORAL_TABLET | Freq: Every day | ORAL | 11 refills | Status: DC

## 2020-02-03 NOTE — Telephone Encounter (Signed)
In addition to My Chart message sent today, also called pt and advised her that Dr. Everardo All is wanting to see her today at 415pm. Appt rescheduled at Dr. George Hugh request. Pt confirmed she would attend appt as scheduled.

## 2020-02-03 NOTE — Progress Notes (Signed)
Subjective:    Patient ID: Monica Todd, female    DOB: Apr 10, 1971, 49 y.o.   MRN: [id number]  HPI Pt returns for f/u of DM:  DM type: 2  Dx'ed: early 9381 Complications: PN Therapy: metformin GDM: 2003 DKA: never.  Severe hypoglycemia: never.  Pancreatitis: never.  Pancreatic imaging: never.  Other: she took insulin for a few mos in 2019; she also has hypertriglyceridemia; she stopped pioglitazone, due to weight gain.   Interval history: no cbg record, but states cbg's vary from 204-284.  She is back on steroids for AB (now day 10 of 12).  She takes metformin, just 1000 mg qd.   Past Medical History:  Diagnosis Date  . Asthma   . Bipolar 1 disorder ([place])   . Depression   . Eczema   . Environmental allergies   . High cholesterol     Past Surgical History:  Procedure Laterality Date  . CESAREAN SECTION    . FOOT SURGERY    . HEMORRHOID SURGERY    . THERAPEUTIC ABORTION      Social History   Socioeconomic History  . Marital status: Married    Spouse name: Not on file  . Number of children: Not on file  . Years of education: Not on file  . Highest education level: Not on file  Occupational History  . Not on file  Tobacco Use  . Smoking status: Former Smoker    Packs/day: 1.00    Types: Cigarettes    Quit date: 07/16/2018    Years since quitting: 1.5  . Smokeless tobacco: Never Used  Substance and Sexual Activity  . Alcohol use: Yes    Comment: rare  . Drug use: No  . Sexual activity: Yes    Birth control/protection: None  Other Topics Concern  . Not on file  Social History Narrative  . Not on file   Social Determinants of Health   Financial Resource Strain:   . Difficulty of Paying Living Expenses: Not on file  Food Insecurity:   . Worried About Charity fundraiser in the Last Year: Not on file  . Ran Out of Food in the Last Year: Not on file  Transportation Needs:   . Lack of Transportation (Medical): Not on file  . Lack of Transportation  (Non-Medical): Not on file  Physical Activity:   . Days of Exercise per Week: Not on file  . Minutes of Exercise per Session: Not on file  Stress:   . Feeling of Stress : Not on file  Social Connections:   . Frequency of Communication with Friends and Family: Not on file  . Frequency of Social Gatherings with Friends and Family: Not on file  . Attends Religious Services: Not on file  . Active Member of Clubs or Organizations: Not on file  . Attends Archivist Meetings: Not on file  . Marital Status: Not on file  Intimate Partner Violence:   . Fear of Current or Ex-Partner: Not on file  . Emotionally Abused: Not on file  . Physically Abused: Not on file  . Sexually Abused: Not on file    Current Outpatient Medications on File Prior to Visit  Medication Sig Dispense Refill  . albuterol (PROVENTIL) (2.5 MG/3ML) 0.083% nebulizer solution albuterol sulfate 2.5 mg/3 mL (0.083 %) solution for nebulization  INHALE 1 VIAL EVERY 4-6 HOURS AS NEEDED FOR WHEEZING    . B-D ULTRAFINE III SHORT PEN 31G X 8 MM MISC USE  WITH LANTUS EVERY AM 100 each 0  . budesonide-formoterol (SYMBICORT) 80-4.5 MCG/ACT inhaler Inhale 2 puffs into the lungs 2 (two) times daily.    Marland Kitchen buPROPion (WELLBUTRIN XL) 150 MG 24 hr tablet Take 150 mg by mouth daily.    . clobetasol cream (TEMOVATE) 0.05 % Apply 1 application topically 2 (two) times daily.    Marland Kitchen desvenlafaxine (PRISTIQ) 100 MG 24 hr tablet Take 100 mg by mouth daily.    . Fenofibrate 40 MG TABS Take 1 tablet (40 mg total) by mouth daily. 90 tablet 3  . fluticasone (FLONASE) 50 MCG/ACT nasal spray fluticasone propionate 50 mcg/actuation nasal spray,suspension  INHALE 1 SPRAY (50 MCG) IN EACH NOSTRIL BY INTRANASAL ROUTE ONCE DAILY FOR 30 DAYS    . Fluticasone Furoate-Vilanterol (BREO ELLIPTA IN) Inhale into the lungs.    Marland Kitchen glucose blood (CONTOUR NEXT TEST) test strip 1 each by Other route 2 (two) times daily. And lancets 2/day (Patient taking differently:  1 each by Other route once a week. And lancets 2/day) 100 each 12  . hydrocortisone cream 1 % Apply topically 2 (two) times daily.    Marland Kitchen ibuprofen (ADVIL,MOTRIN) 600 MG tablet Take 600 mg by mouth every 6 (six) hours as needed.    Marland Kitchen ipratropium-albuterol (DUONEB) 0.5-2.5 (3) MG/3ML SOLN ipratropium-albuterol 0.5 mg-3 mg(2.5 mg base)/3 mL nebulization soln  INHALE 1 VIAL EVERY 6 HOURS AS NEEDED FOR WHEEZING    . levocetirizine (XYZAL) 5 MG tablet Take 5 mg by mouth every evening.    Marland Kitchen lisinopril (PRINIVIL,ZESTRIL) 5 MG tablet Take 5 mg by mouth daily.  0  . metFORMIN (GLUCOPHAGE) 500 MG tablet Take 2 tablets (1,000 mg total) by mouth 2 (two) times daily with a meal. 180 tablet 3  . neomycin-polymyxin-pramoxine (NEOSPORIN PLUS) 1 % cream Apply topically 2 (two) times daily.    . rosuvastatin (CRESTOR) 5 MG tablet Take 1 tablet (5 mg total) by mouth daily. 90 tablet 3  . varenicline (CHANTIX) 1 MG tablet Take 1 mg by mouth 2 (two) times daily.    . beclomethasone (QVAR) 80 MCG/ACT inhaler Inhale 1 puff into the lungs as needed. 1 Inhaler 1   No current facility-administered medications on file prior to visit.    Allergies  Allergen Reactions  . Penicillins   . Sulfa Antibiotics   . Keflex [Cephalexin] Rash    Family History  Problem Relation Age of Onset  . Diabetes Other   . Cancer Other   . Coronary artery disease Other   . Asthma Other   . Diabetes Father     BP 130/70 (BP Location: Left Arm, Patient Position: Sitting, Cuff Size: Large)   Pulse 67   Ht 5\' 5"  (1.651 m)   Wt 180 lb (81.6 kg)   LMP 06/25/2012   SpO2 96%   BMI 29.95 kg/m    Review of Systems She denies hypoglycemia and weight change.      Objective:   Physical Exam VITAL SIGNS:  See vs page GENERAL: no distress Pulses: dorsalis pedis intact bilat.   MSK: no deformity of the feet CV: no leg edema Skin:  no ulcer on the feet.  normal color and temp on the feet. Neuro: sensation is intact to touch on the  feet  Lab Results  Component Value Date   HGBA1C 7.1 (A) 10/08/2019        Assessment & Plan:  Type 2 DM: She would benefit from increased rx, if it can be done with  a regimen that avoids or minimizes hypoglycemia. AB, new: prednisone is affecting A1c  Patient Instructions  check your blood sugar once a day.  vary the time of day when you check, between before the 3 meals, and at bedtime.  also check if you have symptoms of your blood sugar being too high or too low.  please keep a record of the readings and bring it to your next appointment here (or you can bring the meter itself).  You can write it on any piece of paper.  please call us sooner if your blood sugar goes below 70, or if you have a lot of readings over 200. I have sent 2 prescriptions to [facility]: to increase the metformin to 2 pills, twice a day, and to add "Rybelsus." Also, take Lantus 15 units per day, to get your blood sugar down to the 100's.  A few days after finishing the prednisone, you will no longer need this.  Please come back for a follow-up appointment in 2-3 weeks, fasting.

## 2020-02-03 NOTE — Telephone Encounter (Signed)
Pt has an appt now tomorrow but has a remaining question in my chart about what she is to do with the insulin until her appt tomorrow AM. Please advise

## 2020-02-03 NOTE — Patient Instructions (Addendum)
check your blood sugar once a day.  vary the time of day when you check, between before the 3 meals, and at bedtime.  also check if you have symptoms of your blood sugar being too high or too low.  please keep a record of the readings and bring it to your next appointment here (or you can bring the meter itself).  You can write it on any piece of paper.  please call us sooner if your blood sugar goes below 70, or if you have a lot of readings over 200. I have sent 2 prescriptions to [facility]: to increase the metformin to 2 pills, twice a day, and to add "Rybelsus." Also, take Lantus 15 units per day, to get your blood sugar down to the 100's.  A few days after finishing the prednisone, you will no longer need this.  Please come back for a follow-up appointment in 2-3 weeks, fasting.

## 2020-02-04 ENCOUNTER — Ambulatory Visit: Payer: BC Managed Care – PPO | Admitting: Endocrinology

## 2020-02-11 ENCOUNTER — Ambulatory Visit: Payer: BC Managed Care – PPO | Admitting: Endocrinology

## 2020-02-18 ENCOUNTER — Other Ambulatory Visit: Payer: Self-pay

## 2020-02-18 DIAGNOSIS — F17209 Nicotine dependence, unspecified, with unspecified nicotine-induced disorders: Secondary | ICD-10-CM | POA: Diagnosis not present

## 2020-02-18 DIAGNOSIS — E1165 Type 2 diabetes mellitus with hyperglycemia: Secondary | ICD-10-CM | POA: Diagnosis not present

## 2020-02-18 DIAGNOSIS — J441 Chronic obstructive pulmonary disease with (acute) exacerbation: Secondary | ICD-10-CM | POA: Diagnosis not present

## 2020-02-18 DIAGNOSIS — E118 Type 2 diabetes mellitus with unspecified complications: Secondary | ICD-10-CM | POA: Diagnosis not present

## 2020-02-20 ENCOUNTER — Other Ambulatory Visit: Payer: Self-pay

## 2020-02-20 ENCOUNTER — Ambulatory Visit: Payer: BC Managed Care – PPO | Admitting: Endocrinology

## 2020-02-20 ENCOUNTER — Encounter: Payer: Self-pay | Admitting: Endocrinology

## 2020-02-20 VITALS — BP 118/60 | HR 95 | Ht 65.0 in | Wt 176.0 lb

## 2020-02-20 DIAGNOSIS — Z794 Long term (current) use of insulin: Secondary | ICD-10-CM

## 2020-02-20 DIAGNOSIS — E119 Type 2 diabetes mellitus without complications: Secondary | ICD-10-CM

## 2020-02-20 LAB — LIPID PANEL
Cholesterol: 128 mg/dL (ref 0–200)
HDL: 23.8 mg/dL — ABNORMAL LOW (ref >39.00)
LDL Cholesterol: 69 mg/dL (ref 0–99)
NonHDL: 104.17
Total CHOL/HDL Ratio: 5
Triglycerides: 175 mg/dL — ABNORMAL HIGH (ref 0.0–149.0)
VLDL: 35 mg/dL (ref 0.0–40.0)

## 2020-02-20 LAB — BASIC METABOLIC PANEL
BUN: 16 mg/dL (ref 6–23)
CO2: 24 mEq/L (ref 19–32)
Calcium: 9.5 mg/dL (ref 8.4–10.5)
Chloride: 104 mEq/L (ref 96–112)
Creatinine, Ser: 0.75 mg/dL (ref 0.40–1.20)
GFR: 81.89 mL/min (ref >60.00)
Glucose, Bld: 157 mg/dL — ABNORMAL HIGH (ref 70–99)
Potassium: 3.8 mEq/L (ref 3.5–5.1)
Sodium: 138 mEq/L (ref 135–145)

## 2020-02-20 LAB — TSH: TSH: 0.96 u[IU]/mL (ref 0.35–4.50)

## 2020-02-20 NOTE — Progress Notes (Signed)
Subjective:    Patient ID: Monica Todd, female    DOB: Mar 18, 1971, 49 y.o.   MRN: [id number]  HPI Pt returns for f/u of DM:  DM type: 2  Dx'ed: early 2019 Complications: PN Therapy: 2 oral meds.   GDM: 2003 DKA: never.  Severe hypoglycemia: never.  Pancreatitis: never.  Pancreatic imaging: never.  Other: she took insulin for a few mos in 2019; she also has hypertriglyceridemia; she stopped pioglitazone, due to weight gain.   Interval history: she brings a record of her cbg's which I have reviewed today.  cbg's vary from 71-196.  She is off steroids x 2 weeks.  She is also off the insulin.    Past Medical History:  Diagnosis Date  . Asthma   . Bipolar 1 disorder (HCC)   . Depression   . Eczema   . Environmental allergies   . High cholesterol     Past Surgical History:  Procedure Laterality Date  . CESAREAN SECTION    . FOOT SURGERY    . HEMORRHOID SURGERY    . THERAPEUTIC ABORTION      Social History   Socioeconomic History  . Marital status: Married    Spouse name: Not on file  . Number of children: Not on file  . Years of education: Not on file  . Highest education level: Not on file  Occupational History  . Not on file  Tobacco Use  . Smoking status: Former Smoker    Packs/day: 1.00    Types: Cigarettes    Quit date: 07/16/2018    Years since quitting: 1.6  . Smokeless tobacco: Never Used  Substance and Sexual Activity  . Alcohol use: Yes    Comment: rare  . Drug use: No  . Sexual activity: Yes    Birth control/protection: None  Other Topics Concern  . Not on file  Social History Narrative  . Not on file   Social Determinants of Health   Financial Resource Strain:   . Difficulty of Paying Living Expenses: Not on file  Food Insecurity:   . Worried About Programme researcher, broadcasting/film/video in the Last Year: Not on file  . Ran Out of Food in the Last Year: Not on file  Transportation Needs:   . Lack of Transportation (Medical): Not on file  . Lack of  Transportation (Non-Medical): Not on file  Physical Activity:   . Days of Exercise per Week: Not on file  . Minutes of Exercise per Session: Not on file  Stress:   . Feeling of Stress : Not on file  Social Connections:   . Frequency of Communication with Friends and Family: Not on file  . Frequency of Social Gatherings with Friends and Family: Not on file  . Attends Religious Services: Not on file  . Active Member of Clubs or Organizations: Not on file  . Attends Banker Meetings: Not on file  . Marital Status: Not on file  Intimate Partner Violence:   . Fear of Current or Ex-Partner: Not on file  . Emotionally Abused: Not on file  . Physically Abused: Not on file  . Sexually Abused: Not on file    Current Outpatient Medications on File Prior to Visit  Medication Sig Dispense Refill  . albuterol (PROVENTIL) (2.5 MG/3ML) 0.083% nebulizer solution albuterol sulfate 2.5 mg/3 mL (0.083 %) solution for nebulization  INHALE 1 VIAL EVERY 4-6 HOURS AS NEEDED FOR WHEEZING    . B-D ULTRAFINE III SHORT  PEN 31G X 8 MM MISC USE WITH LANTUS EVERY AM 100 each 0  . budesonide-formoterol (SYMBICORT) 80-4.5 MCG/ACT inhaler Inhale 2 puffs into the lungs 2 (two) times daily.    Marland Kitchen buPROPion (WELLBUTRIN XL) 150 MG 24 hr tablet Take 150 mg by mouth daily.    . clobetasol cream (TEMOVATE) 0.05 % Apply 1 application topically 2 (two) times daily.    Marland Kitchen desvenlafaxine (PRISTIQ) 100 MG 24 hr tablet Take 100 mg by mouth daily.    . Fenofibrate 40 MG TABS Take 1 tablet (40 mg total) by mouth daily. 90 tablet 3  . fluticasone (FLONASE) 50 MCG/ACT nasal spray fluticasone propionate 50 mcg/actuation nasal spray,suspension  INHALE 1 SPRAY (50 MCG) IN EACH NOSTRIL BY INTRANASAL ROUTE ONCE DAILY FOR 30 DAYS    . Fluticasone Furoate-Vilanterol (BREO ELLIPTA IN) Inhale into the lungs.    Marland Kitchen glucose blood (CONTOUR NEXT TEST) test strip 1 each by Other route 2 (two) times daily. And lancets 2/day (Patient  taking differently: 1 each by Other route once a week. And lancets 2/day) 100 each 12  . hydrocortisone cream 1 % Apply topically 2 (two) times daily.    Marland Kitchen ibuprofen (ADVIL,MOTRIN) 600 MG tablet Take 600 mg by mouth every 6 (six) hours as needed.    Marland Kitchen ipratropium-albuterol (DUONEB) 0.5-2.5 (3) MG/3ML SOLN ipratropium-albuterol 0.5 mg-3 mg(2.5 mg base)/3 mL nebulization soln  INHALE 1 VIAL EVERY 6 HOURS AS NEEDED FOR WHEEZING    . levocetirizine (XYZAL) 5 MG tablet Take 5 mg by mouth every evening.    Marland Kitchen lisinopril (PRINIVIL,ZESTRIL) 5 MG tablet Take 5 mg by mouth daily.  0  . metFORMIN (GLUCOPHAGE) 500 MG tablet Take 2 tablets (1,000 mg total) by mouth 2 (two) times daily with a meal. 180 tablet 3  . neomycin-polymyxin-pramoxine (NEOSPORIN PLUS) 1 % cream Apply topically 2 (two) times daily.    . rosuvastatin (CRESTOR) 5 MG tablet Take 1 tablet (5 mg total) by mouth daily. 90 tablet 3  . Semaglutide (RYBELSUS) 3 MG TABS Take 3 mg by mouth daily. 30 tablet 11  . varenicline (CHANTIX) 1 MG tablet Take 1 mg by mouth 2 (two) times daily.    . beclomethasone (QVAR) 80 MCG/ACT inhaler Inhale 1 puff into the lungs as needed. 1 Inhaler 1   No current facility-administered medications on file prior to visit.    Allergies  Allergen Reactions  . Penicillins   . Sulfa Antibiotics   . Keflex [Cephalexin] Rash    Family History  Problem Relation Age of Onset  . Diabetes Other   . Cancer Other   . Coronary artery disease Other   . Asthma Other   . Diabetes Father     BP 118/60 (BP Location: Right Arm, Patient Position: Sitting, Cuff Size: Large)   Pulse 95   Ht 5\' 5"  (1.651 m)   Wt 176 lb (79.8 kg)   LMP 06/25/2012   SpO2 97%   BMI 29.29 kg/m    Review of Systems She denies hypoglycemia.  She has mild nausea.      Objective:   Physical Exam VITAL SIGNS:  See vs page GENERAL: no distress.    Lab Results  Component Value Date   HGBA1C 7.9 (A) 02/03/2020    Lab Results   Component Value Date   CHOL 128 02/20/2020   HDL 23.80 (L) 02/20/2020   LDLCALC 69 02/20/2020   LDLDIRECT 120.0 08/27/2018   TRIG 175.0 (H) 02/20/2020   CHOLHDL 5  02/20/2020       Assessment & Plan:  Type 2 DM, with PN: worse, prob due to steroid rx Dyslipidemia: improved control.   Patient Instructions  check your blood sugar once a day.  vary the time of day when you check, between before the 3 meals, and at bedtime.  also check if you have symptoms of your blood sugar being too high or too low.  please keep a record of the readings and bring it to your next appointment here (or you can bring the meter itself).  You can write it on any piece of paper.  please call us sooner if your blood sugar goes below 70, or if you have a lot of readings over 200. Blood tests are requested for you today.  We'll let you know about the results.  Please continue the same medications.  Please come back for a follow-up appointment in 2-3 months, fasting.

## 2020-02-20 NOTE — Patient Instructions (Addendum)
check your blood sugar once a day.  vary the time of day when you check, between before the 3 meals, and at bedtime.  also check if you have symptoms of your blood sugar being too high or too low.  please keep a record of the readings and bring it to your next appointment here (or you can bring the meter itself).  You can write it on any piece of paper.  please call us sooner if your blood sugar goes below 70, or if you have a lot of readings over 200. Blood tests are requested for you today.  We'll let you know about the results.  Please continue the same medications.  Please come back for a follow-up appointment in 2-3 months, fasting.

## 2020-02-24 DIAGNOSIS — Z7189 Other specified counseling: Secondary | ICD-10-CM | POA: Diagnosis not present

## 2020-02-24 DIAGNOSIS — Z20828 Contact with and (suspected) exposure to other viral communicable diseases: Secondary | ICD-10-CM | POA: Diagnosis not present

## 2020-03-16 DIAGNOSIS — E1165 Type 2 diabetes mellitus with hyperglycemia: Secondary | ICD-10-CM | POA: Diagnosis not present

## 2020-03-16 DIAGNOSIS — E118 Type 2 diabetes mellitus with unspecified complications: Secondary | ICD-10-CM | POA: Diagnosis not present

## 2020-03-16 DIAGNOSIS — J441 Chronic obstructive pulmonary disease with (acute) exacerbation: Secondary | ICD-10-CM | POA: Diagnosis not present

## 2020-03-16 DIAGNOSIS — F17209 Nicotine dependence, unspecified, with unspecified nicotine-induced disorders: Secondary | ICD-10-CM | POA: Diagnosis not present

## 2020-04-23 ENCOUNTER — Encounter: Payer: Self-pay | Admitting: Endocrinology

## 2020-05-05 DIAGNOSIS — E118 Type 2 diabetes mellitus with unspecified complications: Secondary | ICD-10-CM | POA: Diagnosis not present

## 2020-05-05 DIAGNOSIS — Z01419 Encounter for gynecological examination (general) (routine) without abnormal findings: Secondary | ICD-10-CM | POA: Diagnosis not present

## 2020-05-05 DIAGNOSIS — Z6829 Body mass index (BMI) 29.0-29.9, adult: Secondary | ICD-10-CM | POA: Diagnosis not present

## 2020-05-05 DIAGNOSIS — E1165 Type 2 diabetes mellitus with hyperglycemia: Secondary | ICD-10-CM | POA: Diagnosis not present

## 2020-05-05 DIAGNOSIS — E119 Type 2 diabetes mellitus without complications: Secondary | ICD-10-CM | POA: Diagnosis not present

## 2020-05-07 ENCOUNTER — Encounter: Payer: Self-pay | Admitting: Endocrinology

## 2020-05-07 ENCOUNTER — Other Ambulatory Visit: Payer: Self-pay

## 2020-05-07 ENCOUNTER — Other Ambulatory Visit: Payer: Self-pay | Admitting: Endocrinology

## 2020-05-07 DIAGNOSIS — Z794 Long term (current) use of insulin: Secondary | ICD-10-CM

## 2020-05-07 DIAGNOSIS — E119 Type 2 diabetes mellitus without complications: Secondary | ICD-10-CM

## 2020-05-07 MED ORDER — BD PEN NEEDLE SHORT U/F 31G X 8 MM MISC: 1.0000 | Freq: Every day | 0 refills | Status: DC

## 2020-05-07 NOTE — Telephone Encounter (Signed)
I have refilled needles but I am not seeing a Rx for Lantus on her medication list. Please advise

## 2020-05-19 DIAGNOSIS — E118 Type 2 diabetes mellitus with unspecified complications: Secondary | ICD-10-CM | POA: Diagnosis not present

## 2020-05-19 DIAGNOSIS — J309 Allergic rhinitis, unspecified: Secondary | ICD-10-CM | POA: Diagnosis not present

## 2020-05-19 DIAGNOSIS — E1165 Type 2 diabetes mellitus with hyperglycemia: Secondary | ICD-10-CM | POA: Diagnosis not present

## 2020-05-19 DIAGNOSIS — J441 Chronic obstructive pulmonary disease with (acute) exacerbation: Secondary | ICD-10-CM | POA: Diagnosis not present

## 2020-05-21 ENCOUNTER — Other Ambulatory Visit: Payer: Self-pay

## 2020-05-21 ENCOUNTER — Encounter: Payer: Self-pay | Admitting: Endocrinology

## 2020-05-21 ENCOUNTER — Ambulatory Visit: Payer: BC Managed Care – PPO | Admitting: Endocrinology

## 2020-05-21 VITALS — BP 140/80 | HR 80 | Ht 65.0 in | Wt 178.0 lb

## 2020-05-21 DIAGNOSIS — Z794 Long term (current) use of insulin: Secondary | ICD-10-CM | POA: Diagnosis not present

## 2020-05-21 DIAGNOSIS — E119 Type 2 diabetes mellitus without complications: Secondary | ICD-10-CM | POA: Diagnosis not present

## 2020-05-21 LAB — POCT GLYCOSYLATED HEMOGLOBIN (HGB A1C): Hemoglobin A1C: 7.5 % — AB (ref 4.0–5.6)

## 2020-05-21 MED ORDER — DAPAGLIFLOZIN PROPANEDIOL 5 MG PO TABS: 5.0000 mg | ORAL_TABLET | Freq: Every day | ORAL | 3 refills | Status: DC

## 2020-05-21 NOTE — Progress Notes (Signed)
Subjective:    Patient ID: Monica Todd, female    DOB: 08-13-1971, 49 y.o.   MRN: [id number]  HPI Pt returns for f/u of DM:  DM type: Insulin-requiring type 2 Dx'ed: early 1937 Complications: PN Therapy: insulin since dx, and metformin GDM: 2003 DKA: never.  Severe hypoglycemia: never.  Pancreatitis: never.  Pancreatic imaging: never.  Other: she took insulin for a few mos in 2019; she also has hypertriglyceridemia; she stopped pioglitazone, due to weight gain.   Interval history: she brings a record of her cbg's.  cbg's vary from 120-195.  She stopped taking rybelsus, due to nausea. Due to prednisone (for COPD-- she has finished today), she takes Lantus 20 units qd.  Past Medical History:  Diagnosis Date  . Asthma   . Bipolar 1 disorder ([place])   . Depression   . Eczema   . Environmental allergies   . High cholesterol     Past Surgical History:  Procedure Laterality Date  . CESAREAN SECTION    . FOOT SURGERY    . HEMORRHOID SURGERY    . THERAPEUTIC ABORTION      Social History   Socioeconomic History  . Marital status: Divorced    Spouse name: Not on file  . Number of children: Not on file  . Years of education: Not on file  . Highest education level: Not on file  Occupational History  . Not on file  Tobacco Use  . Smoking status: Former Smoker    Packs/day: 1.00    Types: Cigarettes    Quit date: 07/16/2018    Years since quitting: 1.8  . Smokeless tobacco: Never Used  Substance and Sexual Activity  . Alcohol use: Yes    Comment: rare  . Drug use: No  . Sexual activity: Yes    Birth control/protection: None  Other Topics Concern  . Not on file  Social History Narrative  . Not on file   Social Determinants of Health   Financial Resource Strain:   . Difficulty of Paying Living Expenses:   Food Insecurity:   . Worried About Charity fundraiser in the Last Year:   . Arboriculturist in the Last Year:   Transportation Needs:   . Lexicographer (Medical):   Marland Kitchen Lack of Transportation (Non-Medical):   Physical Activity:   . Days of Exercise per Week:   . Minutes of Exercise per Session:   Stress:   . Feeling of Stress :   Social Connections:   . Frequency of Communication with Friends and Family:   . Frequency of Social Gatherings with Friends and Family:   . Attends Religious Services:   . Active Member of Clubs or Organizations:   . Attends Archivist Meetings:   Marland Kitchen Marital Status:   Intimate Partner Violence:   . Fear of Current or Ex-Partner:   . Emotionally Abused:   Marland Kitchen Physically Abused:   . Sexually Abused:     Current Outpatient Medications on File Prior to Visit  Medication Sig Dispense Refill  . albuterol (PROVENTIL) (2.5 MG/3ML) 0.083% nebulizer solution albuterol sulfate 2.5 mg/3 mL (0.083 %) solution for nebulization  INHALE 1 VIAL EVERY 4-6 HOURS AS NEEDED FOR WHEEZING    . budesonide-formoterol (SYMBICORT) 80-4.5 MCG/ACT inhaler Inhale 2 puffs into the lungs 2 (two) times daily.    . clobetasol cream (TEMOVATE) 9.02 % Apply 1 application topically 2 (two) times daily.    Marland Kitchen desvenlafaxine (PRISTIQ)  100 MG 24 hr tablet Take 100 mg by mouth daily.    . Fenofibrate 40 MG TABS Take 1 tablet (40 mg total) by mouth daily. 90 tablet 3  . fluticasone (FLONASE) 50 MCG/ACT nasal spray fluticasone propionate 50 mcg/actuation nasal spray,suspension  INHALE 1 SPRAY (50 MCG) IN EACH NOSTRIL BY INTRANASAL ROUTE ONCE DAILY FOR 30 DAYS    . glucose blood (CONTOUR NEXT TEST) test strip 1 each by Other route 2 (two) times daily. And lancets 2/day (Patient taking differently: 1 each by Other route once a week. And lancets 2/day) 100 each 12  . hydrocortisone cream 1 % Apply topically 2 (two) times daily.    Marland Kitchen ibuprofen (ADVIL,MOTRIN) 600 MG tablet Take 600 mg by mouth every 6 (six) hours as needed.    . Insulin Pen Needle (B-D ULTRAFINE III SHORT PEN) 31G X 8 MM MISC 1 each by Other route daily. 100 each 0    . ipratropium-albuterol (DUONEB) 0.5-2.5 (3) MG/3ML SOLN ipratropium-albuterol 0.5 mg-3 mg(2.5 mg base)/3 mL nebulization soln  INHALE 1 VIAL EVERY 6 HOURS AS NEEDED FOR WHEEZING    . levocetirizine (XYZAL) 5 MG tablet Take 5 mg by mouth every evening.    Marland Kitchen lisinopril (PRINIVIL,ZESTRIL) 5 MG tablet Take 5 mg by mouth daily.  0  . metFORMIN (GLUCOPHAGE) 500 MG tablet Take 2 tablets (1,000 mg total) by mouth 2 (two) times daily with a meal. 180 tablet 3  . neomycin-polymyxin-pramoxine (NEOSPORIN PLUS) 1 % cream Apply topically 2 (two) times daily.    . rosuvastatin (CRESTOR) 5 MG tablet Take 1 tablet (5 mg total) by mouth daily. 90 tablet 3  . varenicline (CHANTIX) 1 MG tablet Take 1 mg by mouth 2 (two) times daily.     No current facility-administered medications on file prior to visit.    Allergies  Allergen Reactions  . Penicillins   . Sulfa Antibiotics   . Keflex [Cephalexin] Rash    Family History  Problem Relation Age of Onset  . Diabetes Other   . Cancer Other   . Coronary artery disease Other   . Asthma Other   . Diabetes Father     BP 140/80   Pulse 80   Ht 5\' 5"  (1.651 m)   Wt 178 lb (80.7 kg)   LMP 06/25/2012   SpO2 98%   BMI 29.62 kg/m    Review of Systems She denies hypoglycemia and nausea.      Objective:   Physical Exam VITAL SIGNS:  See vs page GENERAL: no distress Pulses: dorsalis pedis intact bilat.   MSK: no deformity of the feet CV: no leg edema Skin:  no ulcer on the feet.  normal color and temp on the feet. Neuro: sensation is intact to touch on the feet.   Ext: there is bilateral onychomycosis of the toenails  Lab Results  Component Value Date   HGBA1C 7.5 (A) 05/21/2020    Lab Results  Component Value Date   CREATININE 0.75 02/20/2020   BUN 16 02/20/2020   NA 138 02/20/2020   K 3.8 02/20/2020   CL 104 02/20/2020   CO2 24 02/20/2020   Lab Results  Component Value Date   CHOL 128 02/20/2020   HDL 23.80 (L) 02/20/2020    LDLCALC 69 02/20/2020   LDLDIRECT 120.0 08/27/2018   TRIG 175.0 (H) 02/20/2020   CHOLHDL 5 02/20/2020        Assessment & Plan:  Type 2 DM: she needs increased rx HTN:  farxiga might help  Patient Instructions  check your blood sugar once a day.  vary the time of day when you check, between before the 3 meals, and at bedtime.  also check if you have symptoms of your blood sugar being too high or too low.  please keep a record of the readings and bring it to your next appointment here (or you can bring the meter itself).  You can write it on any piece of paper.  please call us sooner if your blood sugar goes below 70, or if you have a lot of readings over 200.   I have sent a prescription to your pharmacy, to add "Marcelline Deist."  Please continue the same metformin.  Please come back for a follow-up appointment in 2-3 months.

## 2020-05-21 NOTE — Patient Instructions (Addendum)
check your blood sugar once a day.  vary the time of day when you check, between before the 3 meals, and at bedtime.  also check if you have symptoms of your blood sugar being too high or too low.  please keep a record of the readings and bring it to your next appointment here (or you can bring the meter itself).  You can write it on any piece of paper.  please call us sooner if your blood sugar goes below 70, or if you have a lot of readings over 200.   I have sent a prescription to your pharmacy, to add "Marcelline Deist."  Please continue the same metformin.  Please come back for a follow-up appointment in 2-3 months.

## 2020-06-08 DIAGNOSIS — Z01419 Encounter for gynecological examination (general) (routine) without abnormal findings: Secondary | ICD-10-CM | POA: Diagnosis not present

## 2020-06-08 DIAGNOSIS — Z6830 Body mass index (BMI) 30.0-30.9, adult: Secondary | ICD-10-CM | POA: Diagnosis not present

## 2020-06-08 DIAGNOSIS — Z124 Encounter for screening for malignant neoplasm of cervix: Secondary | ICD-10-CM | POA: Diagnosis not present

## 2020-06-10 ENCOUNTER — Other Ambulatory Visit: Payer: Self-pay | Admitting: Obstetrics & Gynecology

## 2020-06-10 DIAGNOSIS — R928 Other abnormal and inconclusive findings on diagnostic imaging of breast: Secondary | ICD-10-CM

## 2020-06-15 DIAGNOSIS — F419 Anxiety disorder, unspecified: Secondary | ICD-10-CM | POA: Insufficient documentation

## 2020-06-15 HISTORY — DX: Anxiety disorder, unspecified: F41.9

## 2020-06-18 DIAGNOSIS — Z6828 Body mass index (BMI) 28.0-28.9, adult: Secondary | ICD-10-CM | POA: Diagnosis not present

## 2020-06-18 DIAGNOSIS — R002 Palpitations: Secondary | ICD-10-CM | POA: Diagnosis not present

## 2020-06-18 DIAGNOSIS — F4322 Adjustment disorder with anxiety: Secondary | ICD-10-CM | POA: Diagnosis not present

## 2020-06-18 DIAGNOSIS — B351 Tinea unguium: Secondary | ICD-10-CM | POA: Diagnosis not present

## 2020-06-25 ENCOUNTER — Ambulatory Visit: Admission: RE | Admit: 2020-06-25 | Payer: BC Managed Care – PPO | Source: Ambulatory Visit

## 2020-06-25 ENCOUNTER — Ambulatory Visit
Admission: RE | Admit: 2020-06-25 | Discharge: 2020-06-25 | Disposition: A | Discharge status: Home / Self Care | Payer: BC Managed Care – PPO | Source: Ambulatory Visit | Attending: Obstetrics & Gynecology | Admitting: Obstetrics & Gynecology

## 2020-06-25 ENCOUNTER — Other Ambulatory Visit: Payer: Self-pay

## 2020-06-25 DIAGNOSIS — R928 Other abnormal and inconclusive findings on diagnostic imaging of breast: Secondary | ICD-10-CM

## 2020-07-02 DIAGNOSIS — J441 Chronic obstructive pulmonary disease with (acute) exacerbation: Secondary | ICD-10-CM | POA: Diagnosis not present

## 2020-07-02 DIAGNOSIS — F4322 Adjustment disorder with anxiety: Secondary | ICD-10-CM | POA: Diagnosis not present

## 2020-07-02 DIAGNOSIS — F17209 Nicotine dependence, unspecified, with unspecified nicotine-induced disorders: Secondary | ICD-10-CM | POA: Diagnosis not present

## 2020-07-02 DIAGNOSIS — J309 Allergic rhinitis, unspecified: Secondary | ICD-10-CM | POA: Diagnosis not present

## 2020-07-12 ENCOUNTER — Other Ambulatory Visit: Payer: Self-pay

## 2020-07-12 ENCOUNTER — Ambulatory Visit: Payer: BC Managed Care – PPO | Admitting: Podiatry

## 2020-07-12 DIAGNOSIS — E1142 Type 2 diabetes mellitus with diabetic polyneuropathy: Secondary | ICD-10-CM

## 2020-07-12 DIAGNOSIS — E119 Type 2 diabetes mellitus without complications: Secondary | ICD-10-CM | POA: Diagnosis not present

## 2020-07-12 DIAGNOSIS — E669 Obesity, unspecified: Secondary | ICD-10-CM

## 2020-07-12 DIAGNOSIS — L301 Dyshidrosis [pompholyx]: Secondary | ICD-10-CM | POA: Diagnosis not present

## 2020-07-12 DIAGNOSIS — L608 Other nail disorders: Secondary | ICD-10-CM

## 2020-07-12 DIAGNOSIS — L603 Nail dystrophy: Secondary | ICD-10-CM

## 2020-07-12 HISTORY — DX: Obesity, unspecified: E66.9

## 2020-07-12 MED ORDER — TRIAMCINOLONE ACETONIDE 0.1 % EX CREA: TOPICAL_CREAM | CUTANEOUS | 2 refills | Status: DC

## 2020-07-12 NOTE — Progress Notes (Signed)
  Subjective:  Patient ID: Monica Todd, female    DOB: 1971/07/05,  MRN: [id number]  Chief Complaint  Patient presents with  . Nail Problem    BL toenials (1-10)  thick and discolored x years no apin - w/ tingling and numbness on toes tx: none   . Diabetes    FBS: 127 x past week A1C: 7.1 PC:: [place] x 1 wk     49 y.o. female presents with the above complaint. History confirmed with patient. States the numbness is a tingling at the tips of all the toes. Reports hx of dyshidrotic eczema, uses bactroban on it occasionally. Read that she had onychomycosis from her notes from her endocrinologist and was referred by her PCP for further eval.  Objective:  Physical Exam: warm, good capillary refill, no trophic changes or ulcerative lesions, normal DP and PT pulses and normal sensory exam. Protective sensation intact with 5.07 Semmes-Weinstein monofilament. Xerotic vesicular plaques both plantar feet. Nails with slight yellowing; pincer nail formation 2nd nail bilat, left hallux transverse ridging.  Assessment:   1. Nail dystrophy   2. Dyshidrotic eczema   3. DM type 2 with diabetic peripheral neuropathy (HCC)   4. Encounter for diabetic foot exam (HCC)   5. Pincer nail deformity    Plan:  Patient was evaluated and treated and all questions answered.  Nail Dystrophy -Discussed etiologies of dystrophy but nails do not appear fungal / mycotic.  DM with Diabetic Peripheral Neuropathy -Likely early small fiber neuropathy given intact protective sensation  Dyshidrotic exczema -Rx Triamcinolone cream. Recc stop mupirocin except in case of true infection.  No follow-ups on file.

## 2020-07-19 DIAGNOSIS — E1165 Type 2 diabetes mellitus with hyperglycemia: Secondary | ICD-10-CM | POA: Diagnosis not present

## 2020-07-19 DIAGNOSIS — J441 Chronic obstructive pulmonary disease with (acute) exacerbation: Secondary | ICD-10-CM | POA: Diagnosis not present

## 2020-07-19 DIAGNOSIS — E118 Type 2 diabetes mellitus with unspecified complications: Secondary | ICD-10-CM | POA: Diagnosis not present

## 2020-08-05 ENCOUNTER — Ambulatory Visit: Payer: BC Managed Care – PPO | Admitting: Endocrinology

## 2020-08-10 DIAGNOSIS — J441 Chronic obstructive pulmonary disease with (acute) exacerbation: Secondary | ICD-10-CM | POA: Diagnosis not present

## 2020-08-10 DIAGNOSIS — F17209 Nicotine dependence, unspecified, with unspecified nicotine-induced disorders: Secondary | ICD-10-CM | POA: Diagnosis not present

## 2020-08-10 DIAGNOSIS — Z6829 Body mass index (BMI) 29.0-29.9, adult: Secondary | ICD-10-CM | POA: Diagnosis not present

## 2020-08-10 DIAGNOSIS — F4322 Adjustment disorder with anxiety: Secondary | ICD-10-CM | POA: Diagnosis not present

## 2020-08-10 DIAGNOSIS — Z03818 Encounter for observation for suspected exposure to other biological agents ruled out: Secondary | ICD-10-CM | POA: Diagnosis not present

## 2020-08-19 ENCOUNTER — Ambulatory Visit: Payer: BC Managed Care – PPO | Admitting: Endocrinology

## 2020-08-31 DIAGNOSIS — Z03818 Encounter for observation for suspected exposure to other biological agents ruled out: Secondary | ICD-10-CM | POA: Diagnosis not present

## 2020-09-07 DIAGNOSIS — Z03818 Encounter for observation for suspected exposure to other biological agents ruled out: Secondary | ICD-10-CM | POA: Diagnosis not present

## 2020-09-09 ENCOUNTER — Other Ambulatory Visit: Payer: Self-pay

## 2020-09-09 ENCOUNTER — Encounter: Payer: Self-pay | Admitting: Endocrinology

## 2020-09-09 DIAGNOSIS — E119 Type 2 diabetes mellitus without complications: Secondary | ICD-10-CM

## 2020-09-09 MED ORDER — METFORMIN HCL 500 MG PO TABS: 1000.0000 mg | ORAL_TABLET | Freq: Two times a day (BID) | ORAL | 3 refills | Status: DC

## 2020-09-14 DIAGNOSIS — Z03818 Encounter for observation for suspected exposure to other biological agents ruled out: Secondary | ICD-10-CM | POA: Diagnosis not present

## 2020-09-15 ENCOUNTER — Other Ambulatory Visit: Payer: Self-pay

## 2020-09-15 ENCOUNTER — Ambulatory Visit: Payer: BC Managed Care – PPO | Admitting: Endocrinology

## 2020-09-15 VITALS — BP 132/68 | HR 76 | Ht 65.0 in | Wt 179.0 lb

## 2020-09-15 DIAGNOSIS — E119 Type 2 diabetes mellitus without complications: Secondary | ICD-10-CM

## 2020-09-15 DIAGNOSIS — Z794 Long term (current) use of insulin: Secondary | ICD-10-CM

## 2020-09-15 LAB — POCT GLYCOSYLATED HEMOGLOBIN (HGB A1C): Hemoglobin A1C: 6.8 % — AB (ref 4.0–5.6)

## 2020-09-15 NOTE — Patient Instructions (Addendum)
check your blood sugar once a day.  vary the time of day when you check, between before the 3 meals, and at bedtime.  also check if you have symptoms of your blood sugar being too high or too low.  please keep a record of the readings and bring it to your next appointment here (or you can bring the meter itself).  You can write it on any piece of paper.  please call us sooner if your blood sugar goes below 70, or if you have a lot of readings over 200.   Please continue the same 2 diabetes medications.   Please come back for a follow-up appointment in 3-4 months.

## 2020-09-15 NOTE — Progress Notes (Signed)
Subjective:    Patient ID: Monica Todd, female    DOB: 10-20-1971, 49 y.o.   MRN: [id number]  HPI Pt returns for f/u of DM:  DM type: Insulin-requiring type 2 Dx'ed: early 2019 Complications: PN Therapy: 2 oral meds GDM: 2003 DKA: never.  Severe hypoglycemia: never.  Pancreatitis: never.  Pancreatic imaging: never.  Other: she takes insulin only when on steroids; she also has hypertriglyceridemia; she stopped pioglitazone, due to weight gain; she did not tolerate Rybelsus (nausea).   Interval history: she brings a record of her cbg's.  cbg's vary from 102-131.  No recent steroids.   Past Medical History:  Diagnosis Date  . Asthma   . Bipolar 1 disorder (HCC)   . Depression   . Eczema   . Environmental allergies   . High cholesterol     Past Surgical History:  Procedure Laterality Date  . CESAREAN SECTION    . FOOT SURGERY    . HEMORRHOID SURGERY    . THERAPEUTIC ABORTION      Social History   Socioeconomic History  . Marital status: Divorced    Spouse name: Not on file  . Number of children: Not on file  . Years of education: Not on file  . Highest education level: Not on file  Occupational History  . Not on file  Tobacco Use  . Smoking status: Former Smoker    Packs/day: 1.00    Types: Cigarettes    Quit date: 07/16/2018    Years since quitting: 2.1  . Smokeless tobacco: Never Used  Substance and Sexual Activity  . Alcohol use: Yes    Comment: rare  . Drug use: No  . Sexual activity: Yes    Birth control/protection: None  Other Topics Concern  . Not on file  Social History Narrative  . Not on file   Social Determinants of Health   Financial Resource Strain:   . Difficulty of Paying Living Expenses: Not on file  Food Insecurity:   . Worried About Programme researcher, broadcasting/film/video in the Last Year: Not on file  . Ran Out of Food in the Last Year: Not on file  Transportation Needs:   . Lack of Transportation (Medical): Not on file  . Lack of Transportation  (Non-Medical): Not on file  Physical Activity:   . Days of Exercise per Week: Not on file  . Minutes of Exercise per Session: Not on file  Stress:   . Feeling of Stress : Not on file  Social Connections:   . Frequency of Communication with Friends and Family: Not on file  . Frequency of Social Gatherings with Friends and Family: Not on file  . Attends Religious Services: Not on file  . Active Member of Clubs or Organizations: Not on file  . Attends Banker Meetings: Not on file  . Marital Status: Not on file  Intimate Partner Violence:   . Fear of Current or Ex-Partner: Not on file  . Emotionally Abused: Not on file  . Physically Abused: Not on file  . Sexually Abused: Not on file    Current Outpatient Medications on File Prior to Visit  Medication Sig Dispense Refill  . albuterol (PROVENTIL) (2.5 MG/3ML) 0.083% nebulizer solution albuterol sulfate 2.5 mg/3 mL (0.083 %) solution for nebulization  INHALE 1 VIAL EVERY 4-6 HOURS AS NEEDED FOR WHEEZING    . BREZTRI AEROSPHERE 160-9-4.8 MCG/ACT AERO TAKE 1 PUFF BY MOUTH EVERY DAY    . citalopram (CELEXA)  20 MG tablet Take 20 mg by mouth daily.    . clobetasol cream (TEMOVATE) 0.05 % Apply 1 application topically 2 (two) times daily.    . dapagliflozin propanediol (FARXIGA) 5 MG TABS tablet Take 1 tablet (5 mg total) by mouth daily before breakfast. 30 tablet 3  . desvenlafaxine (PRISTIQ) 100 MG 24 hr tablet Take 100 mg by mouth daily.    . Fenofibrate 40 MG TABS Take 1 tablet (40 mg total) by mouth daily. 90 tablet 3  . fluticasone (FLONASE) 50 MCG/ACT nasal spray fluticasone propionate 50 mcg/actuation nasal spray,suspension  INHALE 1 SPRAY (50 MCG) IN EACH NOSTRIL BY INTRANASAL ROUTE ONCE DAILY FOR 30 DAYS    . glucose blood (CONTOUR NEXT TEST) test strip 1 each by Other route 2 (two) times daily. And lancets 2/day (Patient taking differently: 1 each by Other route once a week. And lancets 2/day) 100 each 12  .  hydrocortisone cream 1 % Apply topically 2 (two) times daily.    Marland Kitchen ibuprofen (ADVIL,MOTRIN) 600 MG tablet Take 600 mg by mouth every 6 (six) hours as needed.    Marland Kitchen ipratropium-albuterol (DUONEB) 0.5-2.5 (3) MG/3ML SOLN ipratropium-albuterol 0.5 mg-3 mg(2.5 mg base)/3 mL nebulization soln  INHALE 1 VIAL EVERY 6 HOURS AS NEEDED FOR WHEEZING    . levocetirizine (XYZAL) 5 MG tablet Take 5 mg by mouth every evening.    Marland Kitchen lisinopril (PRINIVIL,ZESTRIL) 5 MG tablet Take 5 mg by mouth daily.  0  . metFORMIN (GLUCOPHAGE) 500 MG tablet Take 2 tablets (1,000 mg total) by mouth 2 (two) times daily with a meal. 180 tablet 3  . montelukast (SINGULAIR) 10 MG tablet Take 10 mg by mouth daily.    . mupirocin cream (BACTROBAN) 2 % Apply 1 application topically 2 (two) times daily.    Marland Kitchen nystatin cream (MYCOSTATIN) Apply topically 2 (two) times daily.    . rosuvastatin (CRESTOR) 5 MG tablet Take 1 tablet (5 mg total) by mouth daily. 90 tablet 3  . triamcinolone cream (KENALOG) 0.1 % Apply 1 fingertip amount to affected area twice daily 30 g 2  . predniSONE (DELTASONE) 1 MG tablet Take 1 mg by mouth daily with breakfast.    . varenicline (CHANTIX) 1 MG tablet Take 1 mg by mouth 2 (two) times daily.     No current facility-administered medications on file prior to visit.    Allergies  Allergen Reactions  . Penicillins   . Sulfa Antibiotics   . Keflex [Cephalexin] Rash    Family History  Problem Relation Age of Onset  . Diabetes Other   . Cancer Other   . Coronary artery disease Other   . Asthma Other   . Diabetes Father     BP 132/68   Pulse 76   Ht 5\' 5"  (1.651 m)   Wt 179 lb (81.2 kg)   LMP 06/25/2012   SpO2 96%   BMI 29.79 kg/m    Review of Systems She denies hypoglycemia    Objective:   Physical Exam VITAL SIGNS:  See vs page GENERAL: no distress Pulses: dorsalis pedis intact bilat.   MSK: no deformity of the feet CV: no leg edema.   Skin:  no ulcer on the feet.  normal color and  temp on the feet. Neuro: sensation is intact to touch on the feet.    A1c=6.8%  Lab Results  Component Value Date   CREATININE 0.75 02/20/2020   BUN 16 02/20/2020   NA 138 02/20/2020  K 3.8 02/20/2020   CL 104 02/20/2020   CO2 24 02/20/2020       Assessment & Plan:  Insulin-requiring type 2 DM, with PN, uncontrolled, as goal A1c is in the low-6's. She declines to increase [place].  Patient Instructions  check your blood sugar once a day.  vary the time of day when you check, between before the 3 meals, and at bedtime.  also check if you have symptoms of your blood sugar being too high or too low.  please keep a record of the readings and bring it to your next appointment here (or you can bring the meter itself).  You can write it on any piece of paper.  please call us sooner if your blood sugar goes below 70, or if you have a lot of readings over 200.   Please continue the same 2 diabetes medications.   Please come back for a follow-up appointment in 3-4 months.

## 2020-09-23 ENCOUNTER — Other Ambulatory Visit: Payer: Self-pay | Admitting: Endocrinology

## 2020-09-28 DIAGNOSIS — Z03818 Encounter for observation for suspected exposure to other biological agents ruled out: Secondary | ICD-10-CM | POA: Diagnosis not present

## 2020-10-22 DIAGNOSIS — Z03818 Encounter for observation for suspected exposure to other biological agents ruled out: Secondary | ICD-10-CM | POA: Diagnosis not present

## 2020-11-10 DIAGNOSIS — Z6830 Body mass index (BMI) 30.0-30.9, adult: Secondary | ICD-10-CM | POA: Diagnosis not present

## 2020-11-10 DIAGNOSIS — E1165 Type 2 diabetes mellitus with hyperglycemia: Secondary | ICD-10-CM | POA: Diagnosis not present

## 2020-11-10 DIAGNOSIS — E118 Type 2 diabetes mellitus with unspecified complications: Secondary | ICD-10-CM | POA: Diagnosis not present

## 2020-11-10 DIAGNOSIS — J441 Chronic obstructive pulmonary disease with (acute) exacerbation: Secondary | ICD-10-CM | POA: Diagnosis not present

## 2020-11-15 DIAGNOSIS — F411 Generalized anxiety disorder: Secondary | ICD-10-CM | POA: Diagnosis not present

## 2020-11-15 DIAGNOSIS — F319 Bipolar disorder, unspecified: Secondary | ICD-10-CM | POA: Diagnosis not present

## 2020-11-22 DIAGNOSIS — F319 Bipolar disorder, unspecified: Secondary | ICD-10-CM | POA: Diagnosis not present

## 2020-11-22 DIAGNOSIS — F411 Generalized anxiety disorder: Secondary | ICD-10-CM | POA: Diagnosis not present

## 2020-11-24 DIAGNOSIS — L301 Dyshidrosis [pompholyx]: Secondary | ICD-10-CM | POA: Diagnosis not present

## 2020-11-24 DIAGNOSIS — E118 Type 2 diabetes mellitus with unspecified complications: Secondary | ICD-10-CM | POA: Diagnosis not present

## 2020-11-24 DIAGNOSIS — E1165 Type 2 diabetes mellitus with hyperglycemia: Secondary | ICD-10-CM | POA: Diagnosis not present

## 2020-11-24 DIAGNOSIS — J441 Chronic obstructive pulmonary disease with (acute) exacerbation: Secondary | ICD-10-CM | POA: Diagnosis not present

## 2020-11-24 DIAGNOSIS — Z03818 Encounter for observation for suspected exposure to other biological agents ruled out: Secondary | ICD-10-CM | POA: Diagnosis not present

## 2020-12-06 DIAGNOSIS — F411 Generalized anxiety disorder: Secondary | ICD-10-CM | POA: Diagnosis not present

## 2020-12-06 DIAGNOSIS — F319 Bipolar disorder, unspecified: Secondary | ICD-10-CM | POA: Diagnosis not present

## 2020-12-16 DIAGNOSIS — F319 Bipolar disorder, unspecified: Secondary | ICD-10-CM | POA: Diagnosis not present

## 2020-12-16 DIAGNOSIS — F411 Generalized anxiety disorder: Secondary | ICD-10-CM | POA: Diagnosis not present

## 2020-12-27 DIAGNOSIS — F319 Bipolar disorder, unspecified: Secondary | ICD-10-CM | POA: Diagnosis not present

## 2020-12-27 DIAGNOSIS — F411 Generalized anxiety disorder: Secondary | ICD-10-CM | POA: Diagnosis not present

## 2021-01-04 DIAGNOSIS — Z03818 Encounter for observation for suspected exposure to other biological agents ruled out: Secondary | ICD-10-CM | POA: Diagnosis not present

## 2021-01-14 DIAGNOSIS — F319 Bipolar disorder, unspecified: Secondary | ICD-10-CM | POA: Diagnosis not present

## 2021-01-14 DIAGNOSIS — F411 Generalized anxiety disorder: Secondary | ICD-10-CM | POA: Diagnosis not present

## 2021-01-17 ENCOUNTER — Other Ambulatory Visit: Payer: Self-pay

## 2021-01-17 ENCOUNTER — Ambulatory Visit: Payer: BC Managed Care – PPO | Admitting: Endocrinology

## 2021-01-17 VITALS — BP 136/74 | HR 73 | Ht 65.0 in | Wt 184.0 lb

## 2021-01-17 DIAGNOSIS — Z794 Long term (current) use of insulin: Secondary | ICD-10-CM | POA: Diagnosis not present

## 2021-01-17 DIAGNOSIS — E119 Type 2 diabetes mellitus without complications: Secondary | ICD-10-CM | POA: Diagnosis not present

## 2021-01-17 LAB — LIPID PANEL
Cholesterol: 198 mg/dL (ref 0–200)
HDL: 35.2 mg/dL — ABNORMAL LOW (ref >39.00)
NonHDL: 163.23
Total CHOL/HDL Ratio: 6
Triglycerides: 341 mg/dL — ABNORMAL HIGH (ref 0.0–149.0)
VLDL: 68.2 mg/dL — ABNORMAL HIGH (ref 0.0–40.0)

## 2021-01-17 LAB — POCT GLYCOSYLATED HEMOGLOBIN (HGB A1C): Hemoglobin A1C: 6.5 % — AB (ref 4.0–5.6)

## 2021-01-17 LAB — BASIC METABOLIC PANEL
BUN: 16 mg/dL (ref 6–23)
CO2: 27 mEq/L (ref 19–32)
Calcium: 9.6 mg/dL (ref 8.4–10.5)
Chloride: 104 mEq/L (ref 96–112)
Creatinine, Ser: 0.8 mg/dL (ref 0.40–1.20)
GFR: 85.8 mL/min (ref >60.00)
Glucose, Bld: 119 mg/dL — ABNORMAL HIGH (ref 70–99)
Potassium: 3.8 mEq/L (ref 3.5–5.1)
Sodium: 139 mEq/L (ref 135–145)

## 2021-01-17 LAB — TSH: TSH: 0.99 u[IU]/mL (ref 0.35–4.50)

## 2021-01-17 LAB — LDL CHOLESTEROL, DIRECT: Direct LDL: 100 mg/dL

## 2021-01-17 NOTE — Progress Notes (Signed)
Subjective:    Patient ID: Monica Todd, female    DOB: 10/16/1971, 50 y.o.   MRN: [id number]  HPI Pt returns for f/u of DM:  DM type: Insulin-requiring type 2 Dx'ed: early 2019 Complications: PN Therapy: 2 oral meds GDM: 2003 DKA: never.  Severe hypoglycemia: never.  Pancreatitis: never.  Pancreatic imaging: never.  Other: she takes insulin only when on steroids; she also has hypertriglyceridemia; she stopped pioglitazone, due to weight gain; she did not tolerate Rybelsus (nausea).   Interval history: pt says cbg's are well-controlled.  No recent steroids.  She takes meds as rx'ed. Past Medical History:  Diagnosis Date  . Asthma   . Bipolar 1 disorder (HCC)   . Depression   . Eczema   . Environmental allergies   . High cholesterol     Past Surgical History:  Procedure Laterality Date  . CESAREAN SECTION    . FOOT SURGERY    . HEMORRHOID SURGERY    . THERAPEUTIC ABORTION      Social History   Socioeconomic History  . Marital status: Divorced    Spouse name: Not on file  . Number of children: Not on file  . Years of education: Not on file  . Highest education level: Not on file  Occupational History  . Not on file  Tobacco Use  . Smoking status: Former Smoker    Packs/day: 1.00    Types: Cigarettes    Quit date: 07/16/2018    Years since quitting: 2.5  . Smokeless tobacco: Never Used  Substance and Sexual Activity  . Alcohol use: Yes    Comment: rare  . Drug use: No  . Sexual activity: Yes    Birth control/protection: None  Other Topics Concern  . Not on file  Social History Narrative  . Not on file   Social Determinants of Health   Financial Resource Strain: Not on file  Food Insecurity: Not on file  Transportation Needs: Not on file  Physical Activity: Not on file  Stress: Not on file  Social Connections: Not on file  Intimate Partner Violence: Not on file    Current Outpatient Medications on File Prior to Visit  Medication Sig Dispense  Refill  . albuterol (PROVENTIL) (2.5 MG/3ML) 0.083% nebulizer solution albuterol sulfate 2.5 mg/3 mL (0.083 %) solution for nebulization  INHALE 1 VIAL EVERY 4-6 HOURS AS NEEDED FOR WHEEZING    . BREZTRI AEROSPHERE 160-9-4.8 MCG/ACT AERO TAKE 1 PUFF BY MOUTH EVERY DAY    . citalopram (CELEXA) 20 MG tablet Take 20 mg by mouth daily.    . clobetasol cream (TEMOVATE) 0.05 % Apply 1 application topically 2 (two) times daily.    Marland Kitchen desvenlafaxine (PRISTIQ) 100 MG 24 hr tablet Take 100 mg by mouth daily.    Marland Kitchen FARXIGA 5 MG TABS tablet TAKE 1 TABLET (5 MG TOTAL) BY MOUTH DAILY BEFORE BREAKFAST. 30 tablet 3  . Fenofibrate 40 MG TABS Take 1 tablet (40 mg total) by mouth daily. 90 tablet 3  . fluticasone (FLONASE) 50 MCG/ACT nasal spray fluticasone propionate 50 mcg/actuation nasal spray,suspension  INHALE 1 SPRAY (50 MCG) IN EACH NOSTRIL BY INTRANASAL ROUTE ONCE DAILY FOR 30 DAYS    . glucose blood (CONTOUR NEXT TEST) test strip 1 each by Other route 2 (two) times daily. And lancets 2/day (Patient taking differently: 1 each by Other route once a week. And lancets 2/day) 100 each 12  . hydrocortisone cream 1 % Apply topically 2 (two) times  daily.    . ibuprofen (ADVIL,MOTRIN) 600 MG tablet Take 600 mg by mouth every 6 (six) hours as needed.    Marland Kitchen ipratropium-albuterol (DUONEB) 0.5-2.5 (3) MG/3ML SOLN ipratropium-albuterol 0.5 mg-3 mg(2.5 mg base)/3 mL nebulization soln  INHALE 1 VIAL EVERY 6 HOURS AS NEEDED FOR WHEEZING    . levocetirizine (XYZAL) 5 MG tablet Take 5 mg by mouth every evening.    Marland Kitchen lisinopril (PRINIVIL,ZESTRIL) 5 MG tablet Take 5 mg by mouth daily.  0  . metFORMIN (GLUCOPHAGE) 500 MG tablet Take 2 tablets (1,000 mg total) by mouth 2 (two) times daily with a meal. 180 tablet 3  . montelukast (SINGULAIR) 10 MG tablet Take 10 mg by mouth daily.    . mupirocin cream (BACTROBAN) 2 % Apply 1 application topically 2 (two) times daily.    Marland Kitchen nystatin cream (MYCOSTATIN) Apply topically 2 (two) times  daily.    . predniSONE (DELTASONE) 1 MG tablet Take 1 mg by mouth daily with breakfast.    . rosuvastatin (CRESTOR) 5 MG tablet Take 1 tablet (5 mg total) by mouth daily. 90 tablet 3  . triamcinolone cream (KENALOG) 0.1 % Apply 1 fingertip amount to affected area twice daily 30 g 2  . varenicline (CHANTIX) 1 MG tablet Take 1 mg by mouth 2 (two) times daily.     No current facility-administered medications on file prior to visit.    Allergies  Allergen Reactions  . Penicillins   . Sulfa Antibiotics   . Keflex [Cephalexin] Rash    Family History  Problem Relation Age of Onset  . Diabetes Other   . Cancer Other   . Coronary artery disease Other   . Asthma Other   . Diabetes Father     BP 136/74 (BP Location: Right Arm, Patient Position: Sitting, Cuff Size: Normal)   Pulse 73   Ht 5\' 5"  (1.651 m)   Wt 184 lb (83.5 kg)   LMP 06/25/2012   SpO2 96%   BMI 30.62 kg/m    Review of Systems She denies hypoglycemia.      Objective:   Physical Exam VITAL SIGNS:  See vs page GENERAL: no distress Pulses: dorsalis pedis intact bilat.   MSK: no deformity of the feet CV: no leg edema Skin:  no ulcer on the feet.  normal color and temp on the feet. Neuro: sensation is intact to touch on the feet  Lab Results  Component Value Date   HGBA1C 6.5 (A) 01/17/2021       Assessment & Plan:  Type 2 DM: well-controlled.  Dyslipidemia: recheck today.    Patient Instructions  check your blood sugar once a day.  vary the time of day when you check, between before the 3 meals, and at bedtime.  also check if you have symptoms of your blood sugar being too high or too low.  please keep a record of the readings and bring it to your next appointment here (or you can bring the meter itself).  You can write it on any piece of paper.  please call 01/19/2021 sooner if your blood sugar goes below 70, or if you have a lot of readings over 200.   Please continue the same 2 diabetes medications.   Blood tests  are requested for you today.  We'll let you know about the results.  Please come back for a follow-up appointment in 4-6 months.

## 2021-01-17 NOTE — Patient Instructions (Addendum)
check your blood sugar once a day.  vary the time of day when you check, between before the 3 meals, and at bedtime.  also check if you have symptoms of your blood sugar being too high or too low.  please keep a record of the readings and bring it to your next appointment here (or you can bring the meter itself).  You can write it on any piece of paper.  please call us sooner if your blood sugar goes below 70, or if you have a lot of readings over 200.   Please continue the same 2 diabetes medications.   Blood tests are requested for you today.  We'll let you know about the results.  Please come back for a follow-up appointment in 4-6 months.

## 2021-01-20 DIAGNOSIS — Z23 Encounter for immunization: Secondary | ICD-10-CM | POA: Diagnosis not present

## 2021-01-26 ENCOUNTER — Other Ambulatory Visit: Payer: Self-pay | Admitting: Endocrinology

## 2021-01-31 DIAGNOSIS — F411 Generalized anxiety disorder: Secondary | ICD-10-CM | POA: Diagnosis not present

## 2021-01-31 DIAGNOSIS — F319 Bipolar disorder, unspecified: Secondary | ICD-10-CM | POA: Diagnosis not present

## 2021-02-21 DIAGNOSIS — F411 Generalized anxiety disorder: Secondary | ICD-10-CM | POA: Diagnosis not present

## 2021-02-21 DIAGNOSIS — F319 Bipolar disorder, unspecified: Secondary | ICD-10-CM | POA: Diagnosis not present

## 2021-02-22 DIAGNOSIS — L301 Dyshidrosis [pompholyx]: Secondary | ICD-10-CM | POA: Diagnosis not present

## 2021-02-22 DIAGNOSIS — E118 Type 2 diabetes mellitus with unspecified complications: Secondary | ICD-10-CM | POA: Diagnosis not present

## 2021-02-22 DIAGNOSIS — J441 Chronic obstructive pulmonary disease with (acute) exacerbation: Secondary | ICD-10-CM | POA: Diagnosis not present

## 2021-02-22 DIAGNOSIS — E1165 Type 2 diabetes mellitus with hyperglycemia: Secondary | ICD-10-CM | POA: Diagnosis not present

## 2021-03-01 ENCOUNTER — Other Ambulatory Visit: Payer: Self-pay | Admitting: Endocrinology

## 2021-03-01 DIAGNOSIS — E119 Type 2 diabetes mellitus without complications: Secondary | ICD-10-CM

## 2021-03-08 DIAGNOSIS — Z03818 Encounter for observation for suspected exposure to other biological agents ruled out: Secondary | ICD-10-CM | POA: Diagnosis not present

## 2021-03-08 DIAGNOSIS — Z20822 Contact with and (suspected) exposure to covid-19: Secondary | ICD-10-CM | POA: Diagnosis not present

## 2021-03-09 DIAGNOSIS — E782 Mixed hyperlipidemia: Secondary | ICD-10-CM | POA: Diagnosis not present

## 2021-03-09 DIAGNOSIS — E1042 Type 1 diabetes mellitus with diabetic polyneuropathy: Secondary | ICD-10-CM | POA: Diagnosis not present

## 2021-03-09 DIAGNOSIS — F411 Generalized anxiety disorder: Secondary | ICD-10-CM | POA: Diagnosis not present

## 2021-03-09 DIAGNOSIS — E1169 Type 2 diabetes mellitus with other specified complication: Secondary | ICD-10-CM | POA: Diagnosis not present

## 2021-03-09 DIAGNOSIS — F319 Bipolar disorder, unspecified: Secondary | ICD-10-CM | POA: Diagnosis not present

## 2021-03-09 DIAGNOSIS — J441 Chronic obstructive pulmonary disease with (acute) exacerbation: Secondary | ICD-10-CM | POA: Diagnosis not present

## 2021-03-24 DIAGNOSIS — J302 Other seasonal allergic rhinitis: Secondary | ICD-10-CM | POA: Diagnosis not present

## 2021-03-24 DIAGNOSIS — E1165 Type 2 diabetes mellitus with hyperglycemia: Secondary | ICD-10-CM | POA: Diagnosis not present

## 2021-03-24 DIAGNOSIS — E118 Type 2 diabetes mellitus with unspecified complications: Secondary | ICD-10-CM | POA: Diagnosis not present

## 2021-03-24 DIAGNOSIS — J441 Chronic obstructive pulmonary disease with (acute) exacerbation: Secondary | ICD-10-CM | POA: Diagnosis not present

## 2021-03-28 DIAGNOSIS — F319 Bipolar disorder, unspecified: Secondary | ICD-10-CM | POA: Diagnosis not present

## 2021-03-28 DIAGNOSIS — F411 Generalized anxiety disorder: Secondary | ICD-10-CM | POA: Diagnosis not present

## 2021-04-11 DIAGNOSIS — Z6831 Body mass index (BMI) 31.0-31.9, adult: Secondary | ICD-10-CM | POA: Diagnosis not present

## 2021-04-11 DIAGNOSIS — J441 Chronic obstructive pulmonary disease with (acute) exacerbation: Secondary | ICD-10-CM | POA: Diagnosis not present

## 2021-04-13 ENCOUNTER — Other Ambulatory Visit: Payer: Self-pay

## 2021-04-13 ENCOUNTER — Ambulatory Visit: Payer: BC Managed Care – PPO | Admitting: Pulmonary Disease

## 2021-04-13 ENCOUNTER — Encounter: Payer: Self-pay | Admitting: Pulmonary Disease

## 2021-04-13 VITALS — BP 132/72 | HR 88 | Temp 97.8°F | Ht 65.0 in | Wt 189.0 lb

## 2021-04-13 DIAGNOSIS — E119 Type 2 diabetes mellitus without complications: Secondary | ICD-10-CM

## 2021-04-13 DIAGNOSIS — R0600 Dyspnea, unspecified: Secondary | ICD-10-CM | POA: Diagnosis not present

## 2021-04-13 DIAGNOSIS — R0689 Other abnormalities of breathing: Secondary | ICD-10-CM

## 2021-04-13 DIAGNOSIS — E669 Obesity, unspecified: Secondary | ICD-10-CM

## 2021-04-13 DIAGNOSIS — Z794 Long term (current) use of insulin: Secondary | ICD-10-CM

## 2021-04-13 DIAGNOSIS — J45901 Unspecified asthma with (acute) exacerbation: Secondary | ICD-10-CM

## 2021-04-13 DIAGNOSIS — J441 Chronic obstructive pulmonary disease with (acute) exacerbation: Secondary | ICD-10-CM

## 2021-04-13 HISTORY — DX: Chronic obstructive pulmonary disease with (acute) exacerbation: J44.1

## 2021-04-13 NOTE — Patient Instructions (Addendum)
Get back on Breztri - 2 puffs twice daily  STOP lisinopril during flare  Blood work - RAST , CBC + diff Congratulations on QUITTING  Schedule PFTs in 4-8 wks

## 2021-04-13 NOTE — Assessment & Plan Note (Addendum)
She certainly seems to have COPD with frequent exacerbations.  Unclear whether she also has a component of asthma based on her childhood history. She has required recurrent steroid tapers and antibiotics.  She does have clinical response to steroids. She has never undergone formal allergy testing, will obtain CBC with differential and a RAST panel.  We will also obtain alpha-1 antitrypsin levels for completion given family history of COPD. She will complete her Medrol Dosepak taper.  I have asked her to resume Breztri.  She will continue to use albuterol for rescue  We will schedule PFTs in the future.  If this is predominantly asthma then we will consider Biologics.  If on the other hand if predominant COPD then add Roflumilast to her regimen Continue Xyzal and Singulair for now

## 2021-04-13 NOTE — Assessment & Plan Note (Signed)
I have asked her to stop lisinopril while she is having persistent cough due to her current exacerbation

## 2021-04-13 NOTE — Progress Notes (Signed)
Subjective:    Patient ID: Monica Todd, female    DOB: 04/26/1971, 50 y.o.   MRN: [id number]  HPI  50 year old ex-smoker who presents to establish care for recurrent COPD/asthma exacerbations. She reports asthma starting as a child worse in third grade, she lived in [place] then and used to see an allergist 4 hours away in [place], required allergy shots.  She started smoking at age 50 and smoked about a pack per day for 38 years, she has quit on and off since 2019, currently quit for the past 7 months. She has noted herself to be allergic to dogs, cats, leaves, pollen and dust.  Triggers for her breathing include seasonal allergies and infections and stress. She has required prednisone on and off for the past 2 to 3 months, this current exacerbation has required prednisone taper since end of March.  She is currently taking her Depo Medrol Dosepak.  She has required 2-3 rounds of antibiotics, doxycycline and Z-Pak. Chest x-ray 03/24/2021 is reported clear I have reviewed PCP notes. She is currently maintained on a regimen of Breztri and albuterol MDI with albuterol nebs to use as needed. Her peak flow has always been low number but I do not see any formal PFTs performed.  She has not had formal allergy testing  PMH -diabetes type 2, on lisinopril for renal protection , eczema Sinus surgery in early 2000 by Dr. Ezzard Standing.  Environment-lives with her son and boyfriend, has 1 dog and 1 cat inside the house, rescue dogs outside.  She works as a Tree surgeon for [employer], recent travel to [place]      Past Medical History:  Diagnosis Date  . Asthma   . Bipolar 1 disorder (HCC)   . Depression   . Eczema   . Environmental allergies   . High cholesterol     Past Surgical History:  Procedure Laterality Date  . CESAREAN SECTION    . FOOT SURGERY    . HEMORRHOID SURGERY    . THERAPEUTIC ABORTION       Allergies  Allergen Reactions  . Azithromycin     "hives"  .  Penicillins   . Sulfa Antibiotics   . Keflex [Cephalexin] Rash    Social History   Socioeconomic History  . Marital status: Divorced    Spouse name: Not on file  . Number of children: Not on file  . Years of education: Not on file  . Highest education level: Not on file  Occupational History  . Not on file  Tobacco Use  . Smoking status: Former Smoker    Packs/day: 1.00    Types: Cigarettes    Quit date: 07/16/2018    Years since quitting: 2.7  . Smokeless tobacco: Never Used  Substance and Sexual Activity  . Alcohol use: Yes    Comment: rare  . Drug use: No  . Sexual activity: Yes    Birth control/protection: None  Other Topics Concern  . Not on file  Social History Narrative  . Not on file   Social Determinants of Health   Financial Resource Strain: Not on file  Food Insecurity: Not on file  Transportation Needs: Not on file  Physical Activity: Not on file  Stress: Not on file  Social Connections: Not on file  Intimate Partner Violence: Not on file   Family History  Problem Relation Age of Onset  . Diabetes Other   . Cancer Other   . Coronary artery disease Other   .  Asthma Other   . Diabetes Father     Review of Systems Shortness of breath with activity Nonproductive cough Indigestion Headaches Nasal congestion and sneezing Itching Anxiety and depression  Constitutional: negative for anorexia, fevers and sweats  Eyes: negative for irritation, redness and visual disturbance  Ears, nose, mouth, throat, and face: negative for earaches, epistaxis, nasal congestion and sore throat  Cardiovascular: negative for chest pain, lower extremity edema, orthopnea, palpitations and syncope  Gastrointestinal: negative for abdominal pain, constipation, diarrhea, melena, nausea and vomiting  Genitourinary:negative for dysuria, frequency and hematuria  Hematologic/lymphatic: negative for bleeding, easy bruising and lymphadenopathy  Musculoskeletal:negative for  arthralgias, muscle weakness and stiff joints  Neurological: negative for coordination problems, gait problems, headaches and weakness  Endocrine: negative for diabetic symptoms including polydipsia, polyuria and weight loss     Objective:   Physical Exam  Gen. Pleasant, well-nourished, in no distress, normal affect ENT - no pallor,icterus, no post nasal drip Neck: No JVD, no thyromegaly, no carotid bruits Lungs: no use of accessory muscles, no dullness to percussion, decreased bilateral without rales or rhonchi  Cardiovascular: Rhythm regular, heart sounds  normal, no murmurs or gallops, no peripheral edema Abdomen: soft and non-tender, no hepatosplenomegaly, BS normal. Musculoskeletal: No deformities, no cyanosis or clubbing Neuro:  alert, non focal       Assessment & Plan:

## 2021-04-13 NOTE — Assessment & Plan Note (Signed)
We will consider home sleep testing in the future once exacerbation resolved

## 2021-04-14 LAB — CBC WITH DIFFERENTIAL/PLATELET
Basophils Absolute: 0 10*3/uL (ref 0.0–0.1)
Basophils Relative: 0.3 % (ref 0.0–3.0)
Eosinophils Absolute: 0 10*3/uL (ref 0.0–0.7)
Eosinophils Relative: 0.3 % (ref 0.0–5.0)
HCT: 42.1 % (ref 36.0–46.0)
Hemoglobin: 13.9 g/dL (ref 12.0–15.0)
Lymphocytes Relative: 15.8 % (ref 12.0–46.0)
Lymphs Abs: 2 10*3/uL (ref 0.7–4.0)
MCHC: 33 g/dL (ref 30.0–36.0)
MCV: 86.9 fl (ref 78.0–100.0)
Monocytes Absolute: 0.4 10*3/uL (ref 0.1–1.0)
Monocytes Relative: 3.2 % (ref 3.0–12.0)
Neutro Abs: 10.1 10*3/uL — ABNORMAL HIGH (ref 1.4–7.7)
Neutrophils Relative %: 80.4 % — ABNORMAL HIGH (ref 43.0–77.0)
Platelets: 279 10*3/uL (ref 150.0–400.0)
RBC: 4.85 Mil/uL (ref 3.87–5.11)
RDW: 14 % (ref 11.5–15.5)
WBC: 12.6 10*3/uL — ABNORMAL HIGH (ref 4.0–10.5)

## 2021-04-19 DIAGNOSIS — J441 Chronic obstructive pulmonary disease with (acute) exacerbation: Secondary | ICD-10-CM | POA: Diagnosis not present

## 2021-04-19 DIAGNOSIS — Z6831 Body mass index (BMI) 31.0-31.9, adult: Secondary | ICD-10-CM | POA: Diagnosis not present

## 2021-04-21 ENCOUNTER — Other Ambulatory Visit: Payer: Self-pay

## 2021-04-21 DIAGNOSIS — J441 Chronic obstructive pulmonary disease with (acute) exacerbation: Secondary | ICD-10-CM

## 2021-04-21 LAB — ALLERGEN PANEL (27) + IGE
Alternaria Alternata IgE: 18.3 kU/L — AB
Aspergillus Fumigatus IgE: 1.97 kU/L — AB
Bahia Grass IgE: 0.21 kU/L — AB
Bermuda Grass IgE: 0.71 kU/L — AB
Cat Dander IgE: >100 kU/L — AB
Cedar, Mountain IgE: 0.89 kU/L — AB
Cladosporium Herbarum IgE: 5.86 kU/L — AB
Cocklebur IgE: 0.28 kU/L — AB
Cockroach, American IgE: 0.2 kU/L — AB
Common Silver Birch IgE: 0.57 kU/L — AB
D Farinae IgE: 51.5 kU/L — AB
D Pteronyssinus IgE: 43.3 kU/L — AB
Dog Dander IgE: >100 kU/L — AB
Elm, American IgE: 2.19 kU/L — AB
Hickory, White IgE: 3.49 kU/L — AB
IgE (Immunoglobulin E), Serum: 14349 IU/mL — ABNORMAL HIGH (ref 6–495)
Johnson Grass IgE: 0.86 kU/L — AB
Kentucky Bluegrass IgE: 0.99 kU/L — AB
Maple/Box Elder IgE: 2.21 kU/L — AB
Mucor Racemosus IgE: 0.7 kU/L — AB
Oak, White IgE: 0.94 kU/L — AB
Penicillium Chrysogen IgE: 1.17 kU/L — AB
Pigweed, Rough IgE: 0.28 kU/L — AB
Plantain, English IgE: 1.7 kU/L — AB
Ragweed, Short IgE: 0.92 kU/L — AB
Setomelanomma Rostrat: 22.4 kU/L — AB
Timothy Grass IgE: 1.11 kU/L — AB
White Mulberry IgE: 0.2 kU/L — AB

## 2021-04-21 NOTE — Progress Notes (Signed)
Called and left message on voicemail to please return phone call to go over lab results. Contact number provided.

## 2021-04-21 NOTE — Progress Notes (Signed)
Patient returned phone call, name and birth date confirmed. Went over lab results per Dr Vassie Loll with patient. All questions answered and patient expressed full understanding and agreeable to Dr Reginia Naas recommendation for referral to allergist. Order placed per Dr Vassie Loll. Nothing further needed at this time.

## 2021-04-22 IMAGING — MG DIGITAL DIAGNOSTIC BILAT W/ TOMO W/ CAD
8 series · 8 of 24 positions shown · non-contrast
Comparison: Previous exam(s).

CLINICAL DATA: Patient for delayed follow-up of probably benign
right breast mass.

EXAM:
DIGITAL DIAGNOSTIC BILATERAL MAMMOGRAM WITH TOMO AND CAD

[L CC synth-2D]
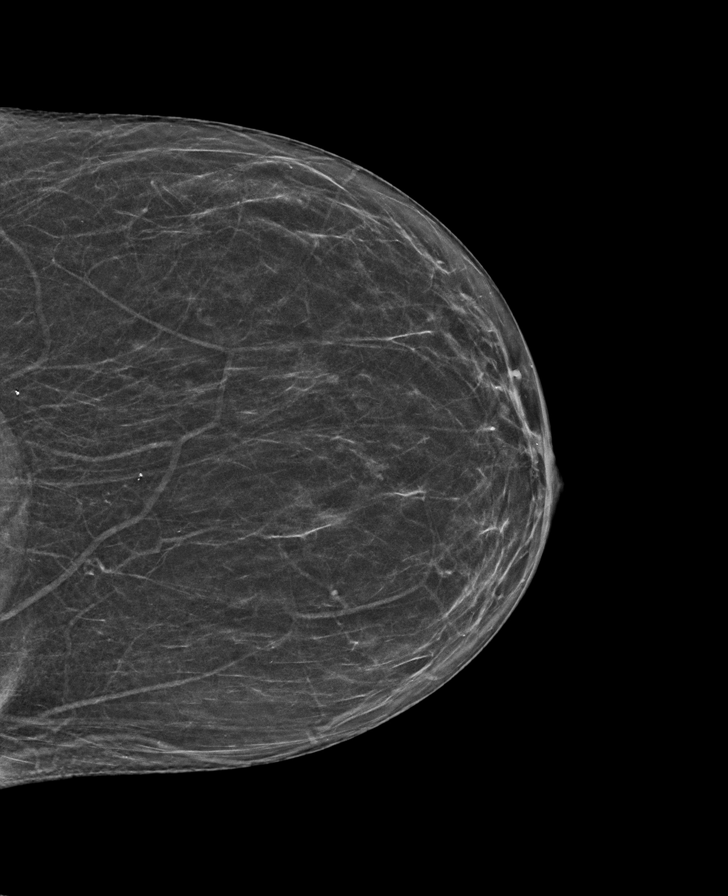

[R MLO synth-2D]
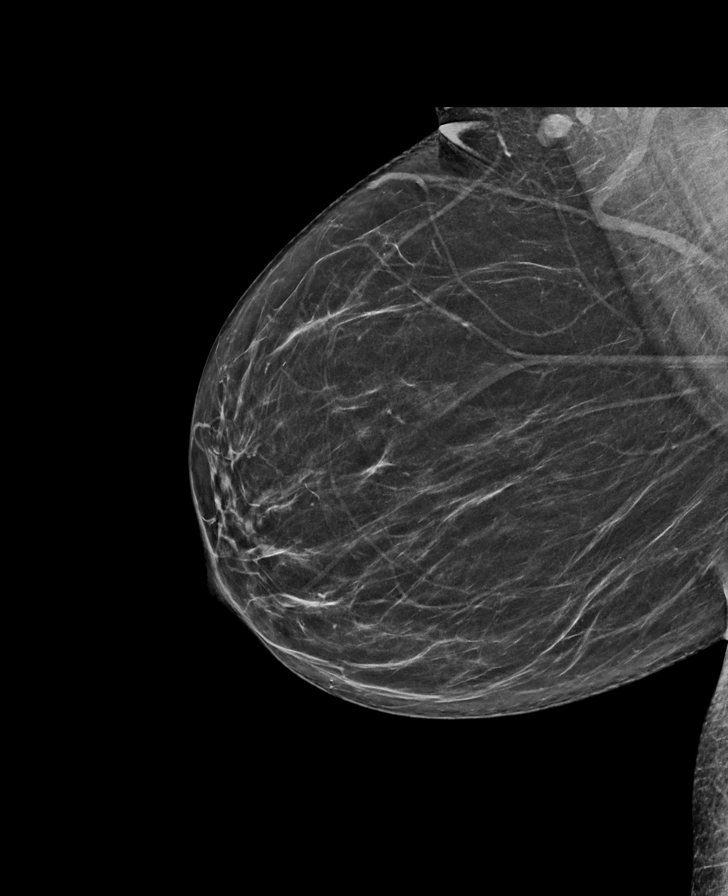

[R CC synth-2D]
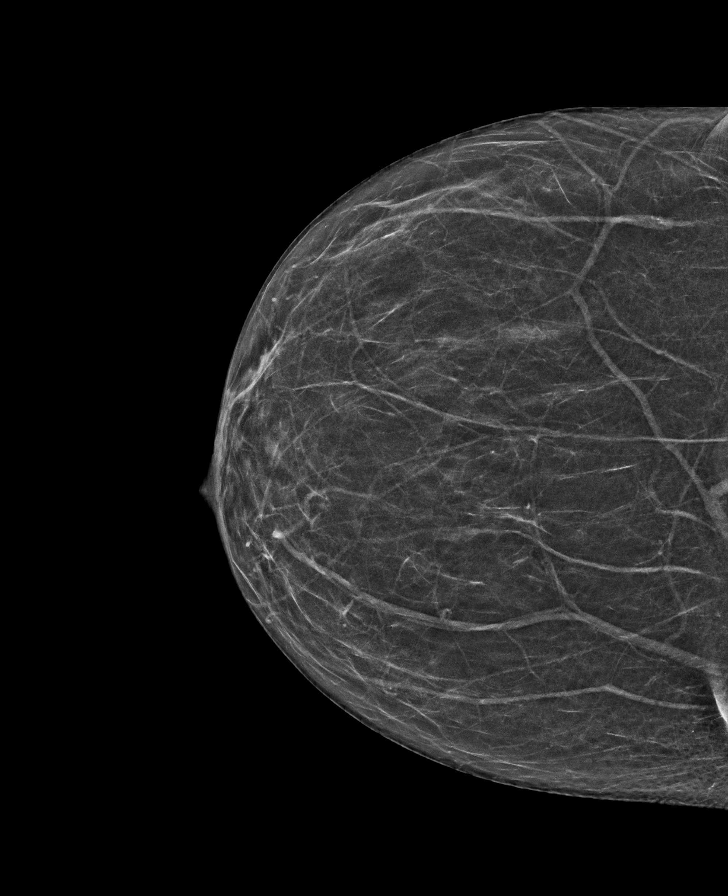

[L MLO synth-2D]
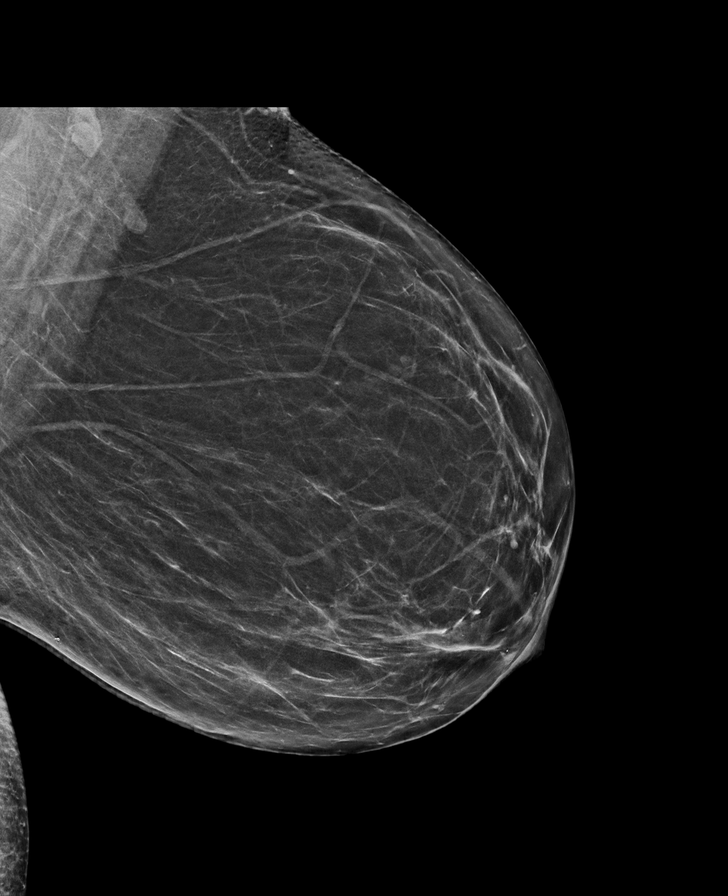

[R CC tomo · tomo slice 29/56.0]
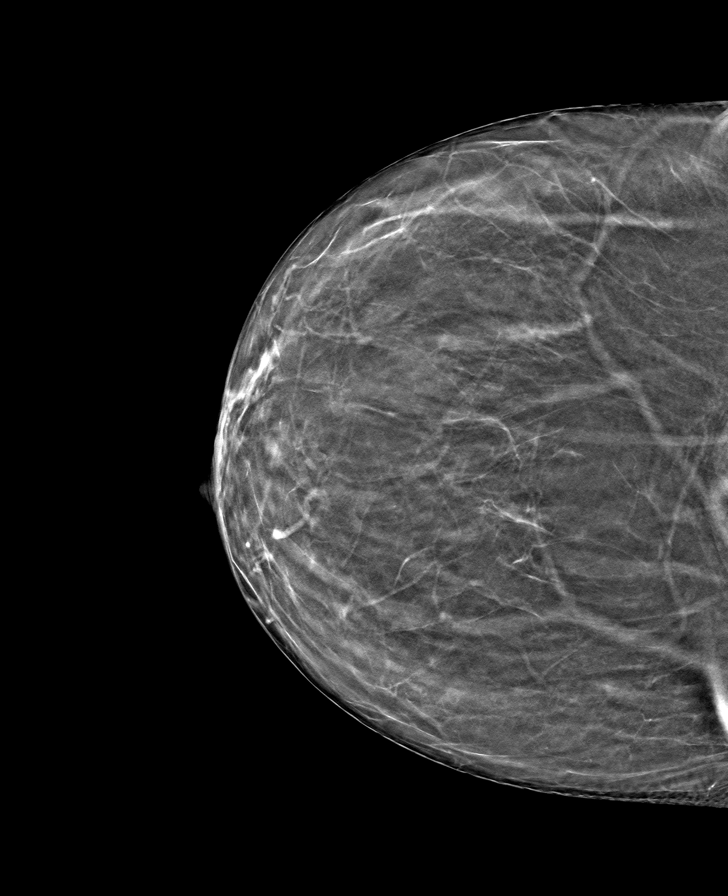

[L MLO tomo · tomo slice 33/66.0]
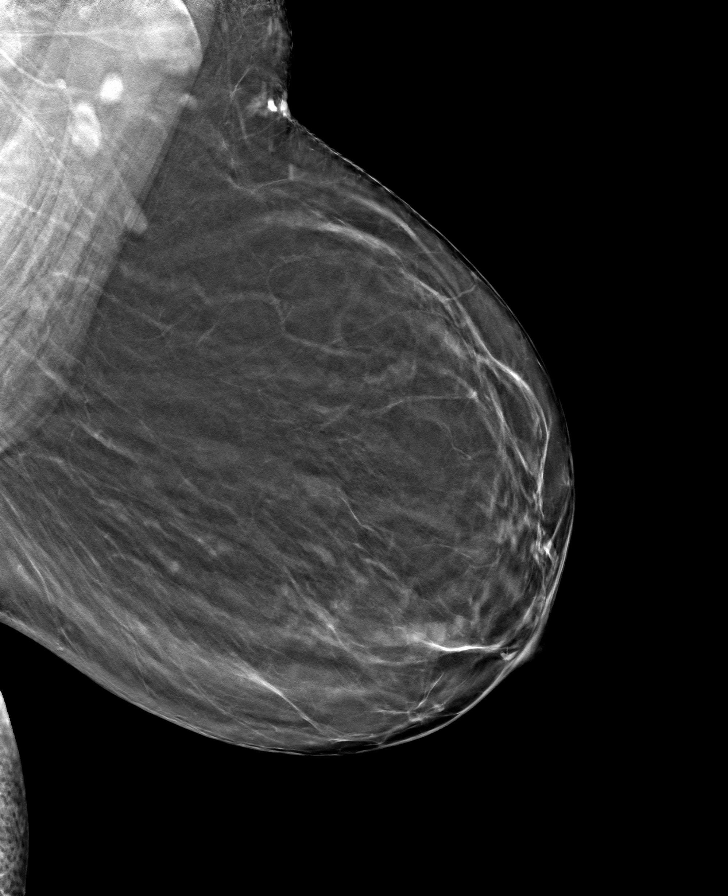

[L CC tomo · tomo slice 29/57.0]
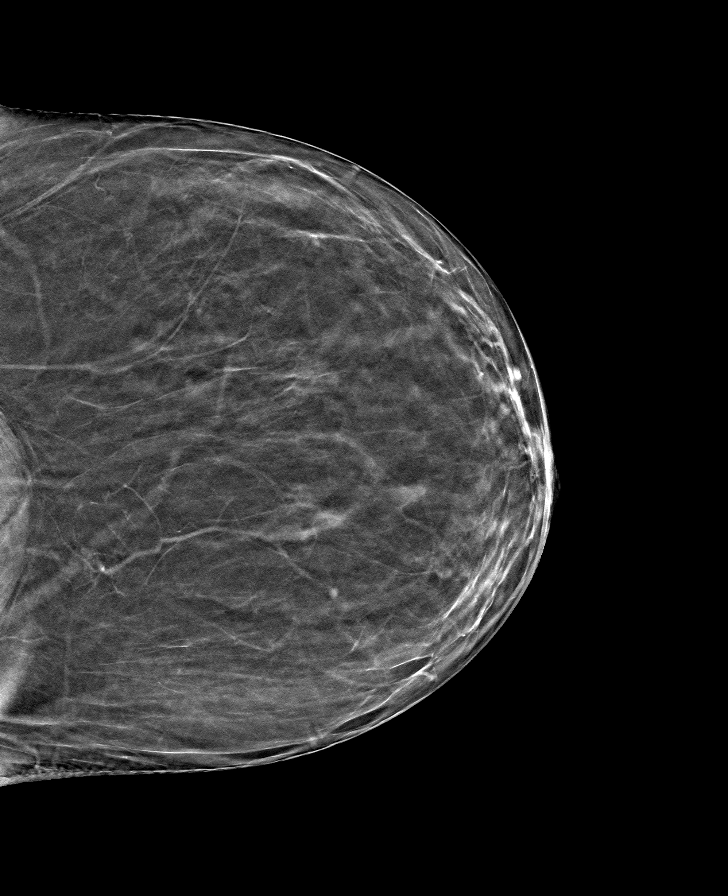

[R MLO tomo · tomo slice 35/68.0]
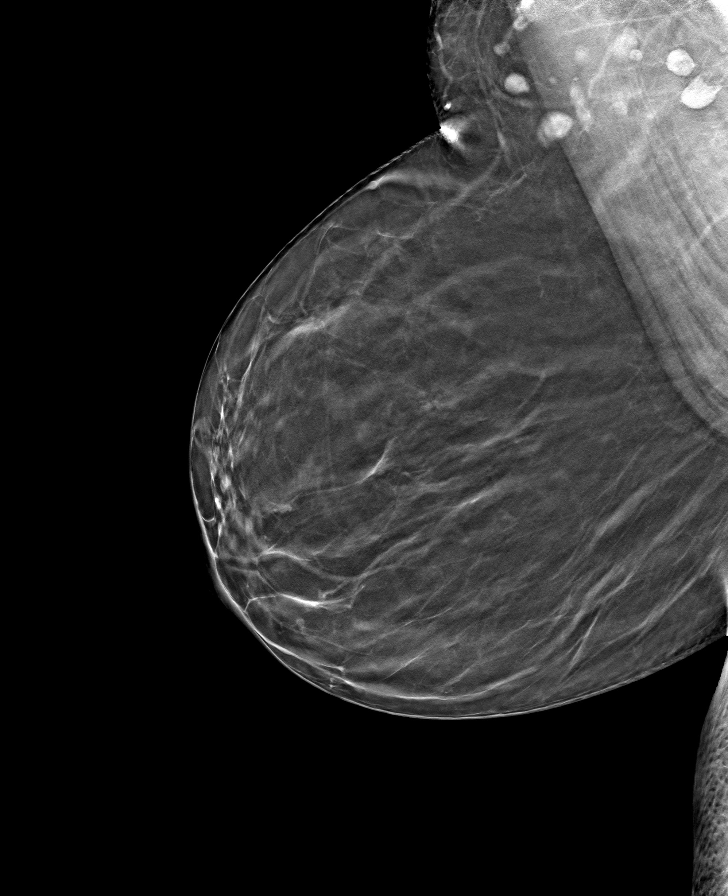

[8 of 24 positions shown; findings below may reference images not displayed]

ACR Breast Density Category b: There are scattered areas of
fibroglandular density.
FINDINGS: Interval resolution of previously visualized mass within the right
breast. No new masses, calcifications or nonsurgical distortion
identified within the right or left breast.

Mammographic images were processed with CAD.
IMPRESSION: No mammographic evidence for malignancy.

RECOMMENDATION:
Screening mammogram in one year.(Code:IW-[id number])

I have discussed the findings and recommendations with the patient.
If applicable, a reminder letter will be sent to the patient
regarding the next appointment.

BI-RADS CATEGORY  2: Benign.

## 2021-04-26 DIAGNOSIS — Z03818 Encounter for observation for suspected exposure to other biological agents ruled out: Secondary | ICD-10-CM | POA: Diagnosis not present

## 2021-04-26 LAB — ALPHA-1 ANTITRYPSIN PHENOTYPE: A-1 Antitrypsin, Ser: 124 mg/dL (ref 83–199)

## 2021-05-06 DIAGNOSIS — Z01419 Encounter for gynecological examination (general) (routine) without abnormal findings: Secondary | ICD-10-CM | POA: Diagnosis not present

## 2021-05-06 DIAGNOSIS — Z6831 Body mass index (BMI) 31.0-31.9, adult: Secondary | ICD-10-CM | POA: Diagnosis not present

## 2021-05-06 DIAGNOSIS — Z789 Other specified health status: Secondary | ICD-10-CM | POA: Diagnosis not present

## 2021-06-02 ENCOUNTER — Other Ambulatory Visit: Payer: Self-pay | Admitting: Endocrinology

## 2021-06-22 ENCOUNTER — Other Ambulatory Visit: Payer: Self-pay

## 2021-06-22 ENCOUNTER — Ambulatory Visit: Payer: BC Managed Care – PPO | Admitting: Endocrinology

## 2021-06-22 VITALS — BP 130/70 | HR 66 | Ht 65.0 in | Wt 192.0 lb

## 2021-06-22 DIAGNOSIS — E119 Type 2 diabetes mellitus without complications: Secondary | ICD-10-CM | POA: Diagnosis not present

## 2021-06-22 DIAGNOSIS — Z794 Long term (current) use of insulin: Secondary | ICD-10-CM | POA: Diagnosis not present

## 2021-06-22 LAB — POCT GLYCOSYLATED HEMOGLOBIN (HGB A1C): Hemoglobin A1C: 7.7 % — AB (ref 4.0–5.6)

## 2021-06-22 MED ORDER — DAPAGLIFLOZIN PROPANEDIOL 10 MG PO TABS
10.0000 mg | ORAL_TABLET | Freq: Every day | ORAL | 3 refills | Status: DC
Start: 2021-06-22 — End: 2022-06-15

## 2021-06-22 NOTE — Patient Instructions (Addendum)
check your blood sugar once a day.  vary the time of day when you check, between before the 3 meals, and at bedtime.  also check if you have symptoms of your blood sugar being too high or too low.  please keep a record of the readings and bring it to your next appointment here (or you can bring the meter itself).  You can write it on any piece of paper.  please call us sooner if your blood sugar goes below 70, or if you have a lot of readings over 200.   I have sent a prescription to your pharmacy, to increase the [place].   Please continue the same metformin. Please come back for a follow-up appointment in 3 months.

## 2021-06-22 NOTE — Progress Notes (Signed)
Subjective:    Patient ID: Monica Todd, female    DOB: [birth year], 50 y.o.   MRN: [id number]  HPI Pt returns for f/u of DM:  DM type: 2 Dx'ed: early 2019 Complications: PN Therapy: 2 oral meds GDM: 2003 DKA: never.  Severe hypoglycemia: never.  Pancreatitis: never.  Pancreatic imaging: never.  Other: she takes insulin only when on steroids; she also has hypertriglyceridemia; she stopped pioglitazone, due to weight gain; she did not tolerate Rybelsus (nausea).   Interval history: pt says cbg's are as in the low to mid-100's.  No recent steroids.  She takes meds as rx'ed.   Past Medical History:  Diagnosis Date   Asthma    Bipolar 1 disorder (HCC)    Depression    Eczema    Environmental allergies    High cholesterol     Past Surgical History:  Procedure Laterality Date   CESAREAN SECTION     FOOT SURGERY     HEMORRHOID SURGERY     THERAPEUTIC ABORTION      Social History   Socioeconomic History   Marital status: Divorced    Spouse name: Not on file   Number of children: Not on file   Years of education: Not on file   Highest education level: Not on file  Occupational History   Not on file  Tobacco Use   Smoking status: Former    Packs/day: 1.00    Pack years: 0.00    Types: Cigarettes    Quit date: 07/16/2018    Years since quitting: 2.9   Smokeless tobacco: Never  Substance and Sexual Activity   Alcohol use: Yes    Comment: rare   Drug use: No   Sexual activity: Yes    Birth control/protection: None  Other Topics Concern   Not on file  Social History Narrative   Not on file   Social Determinants of Health   Financial Resource Strain: Not on file  Food Insecurity: Not on file  Transportation Needs: Not on file  Physical Activity: Not on file  Stress: Not on file  Social Connections: Not on file  Intimate Partner Violence: Not on file    Current Outpatient Medications on File Prior to Visit  Medication Sig Dispense Refill   albuterol  (PROVENTIL) (2.5 MG/3ML) 0.083% nebulizer solution albuterol sulfate 2.5 mg/3 mL (0.083 %) solution for nebulization  INHALE 1 VIAL EVERY 4-6 HOURS AS NEEDED FOR WHEEZING     BREZTRI AEROSPHERE 160-9-4.8 MCG/ACT AERO TAKE 1 PUFF BY MOUTH EVERY DAY     citalopram (CELEXA) 20 MG tablet Take 20 mg by mouth daily.     clobetasol cream (TEMOVATE) 0.05 % Apply 1 application topically 2 (two) times daily.     desvenlafaxine (PRISTIQ) 100 MG 24 hr tablet Take 100 mg by mouth daily.     Fenofibrate 40 MG TABS Take 1 tablet (40 mg total) by mouth daily. 90 tablet 3   fluticasone (FLONASE) 50 MCG/ACT nasal spray fluticasone propionate 50 mcg/actuation nasal spray,suspension  INHALE 1 SPRAY (50 MCG) IN EACH NOSTRIL BY INTRANASAL ROUTE ONCE DAILY FOR 30 DAYS     glucose blood (CONTOUR NEXT TEST) test strip 1 each by Other route 2 (two) times daily. And lancets 2/day (Patient taking differently: 1 each by Other route once a week. And lancets 2/day) 100 each 12   hydrocortisone cream 1 % Apply topically 2 (two) times daily.     ibuprofen (ADVIL,MOTRIN) 600 MG tablet Take 600  mg by mouth every 6 (six) hours as needed.     ipratropium-albuterol (DUONEB) 0.5-2.5 (3) MG/3ML SOLN ipratropium-albuterol 0.5 mg-3 mg(2.5 mg base)/3 mL nebulization soln  INHALE 1 VIAL EVERY 6 HOURS AS NEEDED FOR WHEEZING     levocetirizine (XYZAL) 5 MG tablet Take 5 mg by mouth every evening.     lisinopril (PRINIVIL,ZESTRIL) 5 MG tablet Take 5 mg by mouth daily.  0   metFORMIN (GLUCOPHAGE) 500 MG tablet TAKE 2 TABLETS (1,000 MG TOTAL) BY MOUTH 2 (TWO) TIMES DAILY WITH A MEAL. 360 tablet 1   montelukast (SINGULAIR) 10 MG tablet Take 10 mg by mouth daily.     mupirocin cream (BACTROBAN) 2 % Apply 1 application topically 2 (two) times daily.     nystatin cream (MYCOSTATIN) Apply topically 2 (two) times daily.     promethazine-dextromethorphan (PROMETHAZINE-DM) 6.25-15 MG/5ML syrup Take 5 mLs by mouth 4 (four) times daily as needed for  cough. Takes prn and reports it is prescribed to short term use.     rosuvastatin (CRESTOR) 5 MG tablet Take 1 tablet (5 mg total) by mouth daily. 90 tablet 3   triamcinolone cream (KENALOG) 0.1 % Apply 1 fingertip amount to affected area twice daily 30 g 2   No current facility-administered medications on file prior to visit.    Allergies  Allergen Reactions   Azithromycin     "hives"   Penicillins    Sulfa Antibiotics    Keflex [Cephalexin] Rash    Family History  Problem Relation Age of Onset   Diabetes Other    Cancer Other    Coronary artery disease Other    Asthma Other    Diabetes Father     BP 130/70   Pulse 66   Ht 5\' 5"  (1.651 m)   Wt 192 lb (87.1 kg)   LMP 06/25/2012   SpO2 97%   BMI 31.95 kg/m    Review of Systems     Objective:   Physical Exam Pulses: dorsalis pedis intact bilat.   MSK: no deformity of the feet CV: no leg edema Skin:  no ulcer on the feet.  normal color and temp on the feet.  Eczematous rash on the plantar aspects of the feet.   Neuro: sensation is intact to touch on the feet.    Lab Results  Component Value Date   HGBA1C 7.7 (A) 06/22/2021       Assessment & Plan:  Type 2 DM: uncontrolled.   Patient Instructions  check your blood sugar once a day.  vary the time of day when you check, between before the 3 meals, and at bedtime.  also check if you have symptoms of your blood sugar being too high or too low.  please keep a record of the readings and bring it to your next appointment here (or you can bring the meter itself).  You can write it on any piece of paper.  please call 06/24/2021 sooner if your blood sugar goes below 70, or if you have a lot of readings over 200.   I have sent a prescription to your pharmacy, to increase the [place].   Please continue the same metformin. Please come back for a follow-up appointment in 3 months.

## 2021-06-29 DIAGNOSIS — Z20822 Contact with and (suspected) exposure to covid-19: Secondary | ICD-10-CM | POA: Diagnosis not present

## 2021-07-18 DIAGNOSIS — F411 Generalized anxiety disorder: Secondary | ICD-10-CM | POA: Diagnosis not present

## 2021-07-18 DIAGNOSIS — F319 Bipolar disorder, unspecified: Secondary | ICD-10-CM | POA: Diagnosis not present

## 2021-07-22 ENCOUNTER — Other Ambulatory Visit: Payer: Self-pay

## 2021-07-22 ENCOUNTER — Encounter: Payer: Self-pay | Admitting: Allergy

## 2021-07-22 ENCOUNTER — Ambulatory Visit: Payer: BC Managed Care – PPO | Admitting: Allergy

## 2021-07-22 VITALS — BP 128/72 | HR 68 | Temp 97.4°F | Resp 8 | Ht 65.0 in | Wt 192.8 lb

## 2021-07-22 DIAGNOSIS — H1013 Acute atopic conjunctivitis, bilateral: Secondary | ICD-10-CM | POA: Diagnosis not present

## 2021-07-22 DIAGNOSIS — L2084 Intrinsic (allergic) eczema: Secondary | ICD-10-CM

## 2021-07-22 DIAGNOSIS — J3089 Other allergic rhinitis: Secondary | ICD-10-CM

## 2021-07-22 DIAGNOSIS — J449 Chronic obstructive pulmonary disease, unspecified: Secondary | ICD-10-CM | POA: Diagnosis not present

## 2021-07-22 DIAGNOSIS — L301 Dyshidrosis [pompholyx]: Secondary | ICD-10-CM

## 2021-07-22 MED ORDER — TRELEGY ELLIPTA 200-62.5-25 MCG/INH IN AEPB: 1.0000 | INHALATION_SPRAY | Freq: Every day | RESPIRATORY_TRACT | 3 refills | Status: DC

## 2021-07-22 MED ORDER — TRIAMCINOLONE ACETONIDE 0.1 % EX CREA: TOPICAL_CREAM | CUTANEOUS | 2 refills | Status: DC

## 2021-07-22 NOTE — Progress Notes (Signed)
New Patient Note  RE: [name] MRN: [id number] DOB: 1971/10/10 Date of Office Visit: 07/22/2021  Referring provider: Oretha Milch, MD Primary care provider: Buckner Malta, MD  Chief Complaint: asthma with copd  History of present illness: Monica Todd is a 50 y.o. female presenting today for consultation for asthma with copd.   She follows with Dr. Vassie Loll in pulmonology for asthma with copd.  She states he thinks she may have allergies that's driving some of her asthma/copd symptoms.  When she flares she reports shortness of breath/difficulty breathing, wheezing, chest tightness.  Sometimes will have productive cough.  She does states dogs/cats exposure, dust exposure does flare symptoms.  She has dogs and cats in the home.  She definitely notes increased symptoms when she is around other people's dogs or cats.  She states she has been using albuterol every 4 hours for this visit as she stopped her allergy medication.   She has proair.  She also has duoneb for as needed use via nebulizer.  When she is on her allergy medications she states albuterol use about once a week.   She is on Breztri 2 puffs twice a day for about 8 months now and she is not sure if she is noticing a benefit.  She states the inhaler that she recalls really telling that it was helpful when she was on Advair discus.  She has required prednisone use in the past and does have benefit with the use.  She is a former smoker and started smoking at the age of 59.  She quit about 11 months ago. With her allergies she reports current symptoms of nasal and eyes are watery, ears are clogged, skin is itchy.  Her allergy regimen consist of Singulair and levocetirizine.  She was on immunotherapy as a child.  No history of food allergy.  She states onions causes indigestion and gas.   She has history of eczema and is in the midst of flare on her legs.   She has OTC cortisone or triamcinolone (needs refill). 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  She states the soles are of the worst when it does flare.  The hands are more manageable..  She has had staph infections before when her dshydrotic eczema has flared requiring antibiotic.  But she tries to stay on top she of her therapies to prevent bleeding into secondary infection.  Will use Bactroban to help prevent recurrent staph infection.   Review of systems: Review of Systems  Constitutional: Negative.   HENT:         See HPI  Eyes:        See HPI  Respiratory:         See HPI  Cardiovascular: Negative.   Gastrointestinal: Negative.   Musculoskeletal: Negative.   Skin:  Positive for itching and rash.  Neurological: Negative.    All other systems negative unless noted above in HPI  Past medical history: Past Medical History:  Diagnosis Date   Asthma    Bipolar 1 disorder (HCC)    Depression    Eczema    Environmental allergies    High cholesterol     Past surgical history: Past Surgical History:  Procedure Laterality Date   CESAREAN SECTION     FOOT SURGERY     HEMORRHOID SURGERY     THERAPEUTIC ABORTION      Family history:  Family History  Problem Relation Age of Onset  Eczema Mother    Asthma Mother    Allergic rhinitis Mother    Eczema Father    Diabetes Father    Allergic rhinitis Sister    Allergic rhinitis Sister    Diabetes Other    Cancer Other    Coronary artery disease Other    Asthma Other     Social history: Lives in a home with carpeting with electric heating and window cooling.  Pets in the home see HPI.  She reports there probably is water damage rendering mildew concerning the home.  There is no roaches in the home.  She is a Education administrator.   Medication List: Current Outpatient Medications  Medication Sig Dispense Refill   albuterol (PROVENTIL) (2.5 MG/3ML) 0.083% nebulizer solution albuterol sulfate 2.5 mg/3 mL (0.083 %) solution for nebulization  INHALE 1 VIAL EVERY 4-6  HOURS AS NEEDED FOR WHEEZING     BREZTRI AEROSPHERE 160-9-4.8 MCG/ACT AERO TAKE 1 PUFF BY MOUTH EVERY DAY     citalopram (CELEXA) 20 MG tablet Take 20 mg by mouth daily.     dapagliflozin propanediol (FARXIGA) 10 MG TABS tablet Take 1 tablet (10 mg total) by mouth daily before breakfast. 90 tablet 3   desvenlafaxine (PRISTIQ) 100 MG 24 hr tablet Take 100 mg by mouth daily.     ezetimibe (ZETIA) 10 MG tablet Take 10 mg by mouth daily.     Fluticasone-Umeclidin-Vilant (TRELEGY ELLIPTA) 200-62.5-25 MCG/INH AEPB Inhale 1 puff into the lungs daily. 28 each 3   glucose blood (CONTOUR NEXT TEST) test strip 1 each by Other route 2 (two) times daily. And lancets 2/day (Patient taking differently: 1 each by Other route once a week. And lancets 2/day) 100 each 12   hydrocortisone cream 1 % Apply topically 2 (two) times daily.     ibuprofen (ADVIL,MOTRIN) 600 MG tablet Take 600 mg by mouth every 6 (six) hours as needed.     ipratropium-albuterol (DUONEB) 0.5-2.5 (3) MG/3ML SOLN ipratropium-albuterol 0.5 mg-3 mg(2.5 mg base)/3 mL nebulization soln  INHALE 1 VIAL EVERY 6 HOURS AS NEEDED FOR WHEEZING     levocetirizine (XYZAL) 5 MG tablet Take 5 mg by mouth every evening.     lisinopril (PRINIVIL,ZESTRIL) 5 MG tablet Take 5 mg by mouth daily.  0   metFORMIN (GLUCOPHAGE) 500 MG tablet TAKE 2 TABLETS (1,000 MG TOTAL) BY MOUTH 2 (TWO) TIMES DAILY WITH A MEAL. 360 tablet 1   montelukast (SINGULAIR) 10 MG tablet Take 10 mg by mouth daily.     mupirocin cream (BACTROBAN) 2 % Apply 1 application topically 2 (two) times daily.     nystatin cream (MYCOSTATIN) Apply topically 2 (two) times daily.     rosuvastatin (CRESTOR) 5 MG tablet Take 1 tablet (5 mg total) by mouth daily. 90 tablet 3   triamcinolone cream (KENALOG) 0.1 % Apply 1 fingertip amount to affected area twice daily 30 g 2   No current facility-administered medications for this visit.    Known medication allergies: Allergies  Allergen Reactions    Azithromycin     "hives"   Penicillins    Sulfa Antibiotics    Keflex [Cephalexin] Rash     Physical examination: Blood pressure 128/72, pulse 68, temperature (!) 97.4 F (36.3 C), temperature source Temporal, resp. rate (!) 8, height 5\' 5"  (1.651 m), weight 192 lb 12.8 oz (87.5 kg), last menstrual period 06/25/2012, SpO2 94 %.  General: Alert, interactive, in no acute distress. HEENT: PERRLA, TMs pearly gray, turbinates mildly  edematous without discharge, post-pharynx non erythematous. Neck: Supple without lymphadenopathy. Lungs: Clear to auscultation without wheezing, rhonchi or rales. {no increased work of breathing. CV: Normal S1, S2 without murmurs. Abdomen: Nondistended, nontender. Skin: Dry, erythematous, excoriated patches on the lateral right lower extremities; palms with thickened patches . Extremities:  No clubbing, cyanosis or edema. Neuro:   Grossly intact.  Diagnositics/Labs: Labs:  Component     Latest Ref Rng & Units 04/13/2021  IgE (Immunoglobulin E), Serum     6 - 495 IU/mL 14,349 (H)  D Pteronyssinus IgE     Class V kU/L 43.30 (A)  D Farinae IgE     Class V kU/L 51.50 (A)  Cat Dander IgE     Class VI kU/L >100 (A)  Dog Dander IgE     Class VI kU/L >100 (A)  [place] Grass IgE     Class II kU/L 0.71 (A)  Timothy Grass IgE     Class II kU/L 1.11 (A)  [place] Bluegrass IgE     Class II kU/L 0.99 (A)  [place] Grass IgE     Class II kU/L 0.86 (A)  B[place]ia Grass IgE     Class 0/I kU/L 0.21 (A)  Cockroach, American IgE     Class 0/I kU/L 0.20 (A)  Penicillium Chrysogen IgE     Class II kU/L 1.17 (A)  Cladosporium Herbarum IgE     Class IV kU/L 5.86 (A)  Aspergillus Fumigatus IgE     Class III kU/L 1.97 (A)  Mucor Racemosus IgE     Class II kU/L 0.70 (A)  Alternaria Alternata IgE     Class IV kU/L 18.30 (A)  Setomelanomma Rostrat     Class V kU/L 22.40 (A)  Oak, White IgE     Class II kU/L 0.94 (A)  Elm, American IgE     Class III kU/L 2.19 (A)   Maple/Box Elder IgE     Class III kU/L 2.21 (A)  Common Silver Charletta CousinBirch IgE     Class II kU/L 0.57 (A)  Hickory, White IgE     Class III kU/L 3.49 (A)  White Mulberry IgE     Class 0/I kU/L 0.20 (A)  Cedar, Mountain IgE     Class II kU/L 0.89 (A)  Ragweed, Short IgE     Class II kU/L 0.92 (A)  Plantain, English IgE     Class III kU/L 1.70 (A)  Cocklebur IgE     Class 0/I kU/L 0.28 (A)  Pigweed, Rough IgE     Class 0/I kU/L 0.28 (A)  WBC     4.0 - 10.5 K/uL 12.6 (H)  RBC     3.87 - 5.11 Mil/uL 4.85  Hemoglobin     12.0 - 15.0 g/dL 54.013.9  HCT     98.136.0 - 19.146.0 % 42.1  MCV     78.0 - 100.0 fl 86.9  MCHC     30.0 - 36.0 g/dL 47.833.0  RDW     29.511.5 - 62.115.5 % 14.0  Platelets     150.0 - 400.0 K/uL 279.0  Neutrophils     43.0 - 77.0 % 80.4 (H)  Lymphocytes     12.0 - 46.0 % 15.8  Monocytes Relative     3.0 - 12.0 % 3.2  Eosinophil     0.0 - 5.0 % 0.3  Basophil     0.0 - 3.0 % 0.3  NEUT#     1.4 - 7.7 K/uL 10.1 (H)  Lymphocyte #     0.7 - 4.0 K/uL 2.0  Monocyte #     0.1 - 1.0 K/uL 0.4  Eosinophils Absolute     0.0 - 0.7 K/uL 0.0  Basophils Absolute     0.0 - 0.1 K/uL 0.0  A-1 Antitrypsin, Ser     83 - 199 mg/dL 322  [id number] (AAT) PHENOTYPE      SEE NOTE    Spirometry: FEV1: 1.36 L 48%, FVC: 2.59 L 73% predicted.  Status post albuterol FEV1 increased to 1.58 L or 56%  Assessment and plan:   Asthma with COPD -Lung function did improve after albuterol use however it did not normalize.   -Do believe a lot of her symptoms are largely asthma driven but she does have a component of COPD -have access to albuterol inhaler 2 puffs every 4-6 hours as needed for cough/wheeze/shortness of breath/chest tightness.  May use 15-20 minutes prior to activity.   Monitor frequency of use.   -She reports improvement with previous Advair discus use.  She is not sure if Markus Daft is having much effect with her control thus we will have her try Trelegy which is a dry powder daily  triple inhaler similar to [employer].  If she finds this to be more effective then she will continue Trelegy if not we can go back to [place].  She will take Trelegy 200 mcg 1 puff once a day -Continue singular 10 mg daily -Biologic asthma therapy as discussed today namely Tezspire and Dupixent.  Since she does have history of eczema we will proceed with Dupixent  Control goals:  Full participation in all desired activities (may need albuterol before activity) Albuterol use two time or less a week on average (not counting use with activity) Cough interfering with sleep two time or less a month Oral steroids no more than once a year No hospitalizations  Environmental allergies -Allergy testing by serum IgE level shows positives to dust mites, cat, dog, grass pollen, tree pollen, weed pollen, cockroach, mold -Recommend performing allergen avoidance measures -Continue levocetirizine 5 mg daily -Continue Singulair as above -For itchy, watery eyes can use over-the-counter Pataday 1 drop each eye daily as needed -Allergen immunotherapy would be beneficial for her once her asthma/COPD is at its best control  Atopic dermatitis with dyshidrotic eczema -Continue moisturization after bathing with thick emollient -Continue triamcinolone twice a day as needed for more moderate to severe eczema flares and hydrocortisone twice a day for mild flares -Use Bactroban for open skin areas to prevent secondary infection -Dupixent injections discussed today for eczema and asthma control.  Benefits and risk discussed as well as home administration every 2 weeks.  Informational brochure provided.  Follow-up in 3 months or sooner if needed predicted.    I appreciate the opportunity to take part in Reni's care. Please do not hesitate to contact me with questions.  Sincerely,   Margo Aye, MD Allergy/Immunology [facility]

## 2021-07-22 NOTE — Patient Instructions (Addendum)
Asthma with COPD -Lung function did improve after albuterol use however it did not normalize.   -Do believe a lot of her symptoms are largely asthma driven but she does have a component of COPD -have access to albuterol inhaler 2 puffs every 4-6 hours as needed for cough/wheeze/shortness of breath/chest tightness.  May use 15-20 minutes prior to activity.   Monitor frequency of use.   -She reports improvement with previous Advair discus use.  She is not sure if Markus Daft is having much effect with her control thus we will have her try Trelegy which is a dry powder daily triple inhaler similar to [employer].  If she finds this to be more effective then she will continue Trelegy if not we can go back to [place].  She will take Trelegy 200 mcg 1 puff once a day -Continue singular 10 mg daily -Biologic asthma therapy as discussed today namely Tezspire and Dupixent.  Since she does have history of eczema we will proceed with Dupixent  Control goals:  Full participation in all desired activities (may need albuterol before activity) Albuterol use two time or less a week on average (not counting use with activity) Cough interfering with sleep two time or less a month Oral steroids no more than once a year No hospitalizations  Environmental allergies -Allergy testing by serum IgE level shows positives to dust mites, cat, dog, grass pollen, tree pollen, weed pollen, cockroach, mold -Recommend performing allergen avoidance measures -Continue levocetirizine 5 mg daily -Continue Singulair as above -For itchy, watery eyes can use over-the-counter Pataday 1 drop each eye daily as needed -Allergen immunotherapy would be beneficial for her once her asthma/COPD is at its best control  Atopic dermatitis with dyshidrotic eczema -Continue moisturization after bathing with thick emollient -Continue triamcinolone twice a day as needed for more moderate to severe eczema flares and hydrocortisone twice a day for mild  flares -Use Bactroban for open skin areas to prevent secondary infection -Dupixent injections discussed today for eczema and asthma control.  Benefits and risk discussed as well as home administration every 2 weeks.  Informational brochure provided.  Follow-up in 3 months or sooner if needed predicted.

## 2021-07-27 ENCOUNTER — Telehealth: Payer: Self-pay | Admitting: *Deleted

## 2021-07-27 NOTE — Telephone Encounter (Signed)
Tried to contact patient mobile number and was advised not accepting calls to call back later. I will try her later to advise approval, copay card and submit for Dupixent

## 2021-07-27 NOTE — Telephone Encounter (Signed)
Called patient back advised approval, copay card and submit to [facility]

## 2021-07-27 NOTE — Telephone Encounter (Signed)
-----   Message from [facility] Larose Hires, MD sent at 07/22/2021  1:00 PM EDT ----- Required redo Dupixent under eczema for her.  She also has history of asthma

## 2021-08-05 DIAGNOSIS — F411 Generalized anxiety disorder: Secondary | ICD-10-CM | POA: Diagnosis not present

## 2021-08-05 DIAGNOSIS — F319 Bipolar disorder, unspecified: Secondary | ICD-10-CM | POA: Diagnosis not present

## 2021-08-22 DIAGNOSIS — F319 Bipolar disorder, unspecified: Secondary | ICD-10-CM | POA: Diagnosis not present

## 2021-08-22 DIAGNOSIS — F411 Generalized anxiety disorder: Secondary | ICD-10-CM | POA: Diagnosis not present

## 2021-08-24 ENCOUNTER — Other Ambulatory Visit: Payer: Self-pay

## 2021-08-24 ENCOUNTER — Ambulatory Visit (INDEPENDENT_AMBULATORY_CARE_PROVIDER_SITE_OTHER): Payer: BC Managed Care – PPO

## 2021-08-24 DIAGNOSIS — L209 Atopic dermatitis, unspecified: Secondary | ICD-10-CM

## 2021-08-24 MED ORDER — DUPILUMAB 300 MG/2ML ~~LOC~~ SOSY
600.0000 mg | PREFILLED_SYRINGE | Freq: Once | SUBCUTANEOUS | Status: AC
  Administered 2021-08-24: 600 mg via SUBCUTANEOUS

## 2021-08-24 NOTE — Progress Notes (Signed)
Immunotherapy   Patient Details  Name: Monica Todd MRN: [id number] Date of Birth: 1971/09/03  08/24/2021  Kathrine Cords Frappier started injections for   Dupixent received a loading dose of 600mg , patient waited in the office for 30 minutes without any reactions. Following schedule: Dupixent   Frequency:Every 2 weeks Epi-Pen:Epi-Pen Available   Consent signed and patient instructions given.   08/24/2021, 3:20 PM

## 2021-09-03 ENCOUNTER — Other Ambulatory Visit: Payer: Self-pay | Admitting: Endocrinology

## 2021-09-03 DIAGNOSIS — Z794 Long term (current) use of insulin: Secondary | ICD-10-CM

## 2021-09-03 DIAGNOSIS — E119 Type 2 diabetes mellitus without complications: Secondary | ICD-10-CM

## 2021-09-05 DIAGNOSIS — F319 Bipolar disorder, unspecified: Secondary | ICD-10-CM | POA: Diagnosis not present

## 2021-09-05 DIAGNOSIS — F411 Generalized anxiety disorder: Secondary | ICD-10-CM | POA: Diagnosis not present

## 2021-09-07 DIAGNOSIS — E118 Type 2 diabetes mellitus with unspecified complications: Secondary | ICD-10-CM | POA: Diagnosis not present

## 2021-09-07 DIAGNOSIS — L301 Dyshidrosis [pompholyx]: Secondary | ICD-10-CM | POA: Diagnosis not present

## 2021-09-07 DIAGNOSIS — E1165 Type 2 diabetes mellitus with hyperglycemia: Secondary | ICD-10-CM | POA: Diagnosis not present

## 2021-09-07 DIAGNOSIS — F324 Major depressive disorder, single episode, in partial remission: Secondary | ICD-10-CM | POA: Diagnosis not present

## 2021-09-19 DIAGNOSIS — F411 Generalized anxiety disorder: Secondary | ICD-10-CM | POA: Diagnosis not present

## 2021-09-19 DIAGNOSIS — F319 Bipolar disorder, unspecified: Secondary | ICD-10-CM | POA: Diagnosis not present

## 2021-09-22 ENCOUNTER — Other Ambulatory Visit: Payer: Self-pay

## 2021-09-22 ENCOUNTER — Ambulatory Visit: Payer: BC Managed Care – PPO | Admitting: Endocrinology

## 2021-09-22 VITALS — BP 140/84 | HR 75 | Ht 65.0 in | Wt 194.4 lb

## 2021-09-22 DIAGNOSIS — E119 Type 2 diabetes mellitus without complications: Secondary | ICD-10-CM

## 2021-09-22 DIAGNOSIS — Z794 Long term (current) use of insulin: Secondary | ICD-10-CM

## 2021-09-22 LAB — POCT GLYCOSYLATED HEMOGLOBIN (HGB A1C): Hemoglobin A1C: 8.1 % — AB (ref 4.0–5.6)

## 2021-09-22 MED ORDER — RYBELSUS 3 MG PO TABS: 3.0000 mg | ORAL_TABLET | Freq: Every day | ORAL | 11 refills | Status: DC

## 2021-09-22 MED ORDER — METFORMIN HCL ER 500 MG PO TB24: 2000.0000 mg | ORAL_TABLET | Freq: Every day | ORAL | 3 refills | Status: DC

## 2021-09-22 NOTE — Patient Instructions (Addendum)
check your blood sugar once a day.  vary the time of day when you check, between before the 3 meals, and at bedtime.  also check if you have symptoms of your blood sugar being too high or too low.  please keep a record of the readings and bring it to your next appointment here (or you can bring the meter itself).  You can write it on any piece of paper.  please call us sooner if your blood sugar goes below 70, or if you have a lot of readings over 200.   I have sent a prescription to your pharmacy, to re-try the Rybelsus.   Please continue the same other 2 meds.   Please come back for a follow-up appointment in January.

## 2021-09-22 NOTE — Progress Notes (Signed)
Subjective:    Patient ID: Monica Todd, female    DOB: [birth year], 50 y.o.   MRN: [id number]  HPI Pt returns for f/u of DM:  DM type: 2 Dx'ed: early 2019 Complications: PN Therapy: 2 oral meds GDM: 2003 DKA: never.  Severe hypoglycemia: never.  Pancreatitis: never.  Pancreatic imaging: never.  Other: she takes insulin only when on steroids; she also has hypertriglyceridemia; she stopped pioglitazone, due to weight gain; she did not tolerate Rybelsus (nausea).   Interval history: pt says cbg's are as in the mid-100's.  No recent steroids.  She takes meds as rx'ed.   Past Medical History:  Diagnosis Date   Asthma    Bipolar 1 disorder (HCC)    Depression    Eczema    Environmental allergies    High cholesterol     Past Surgical History:  Procedure Laterality Date   CESAREAN SECTION     FOOT SURGERY     HEMORRHOID SURGERY     THERAPEUTIC ABORTION      Social History   Socioeconomic History   Marital status: Divorced    Spouse name: Not on file   Number of children: Not on file   Years of education: Not on file   Highest education level: Not on file  Occupational History   Not on file  Tobacco Use   Smoking status: Former    Packs/day: 1.00    Types: Cigarettes    Quit date: 07/16/2018    Years since quitting: 3.1   Smokeless tobacco: Never  Vaping Use   Vaping Use: Never used  Substance and Sexual Activity   Alcohol use: Yes    Comment: rare   Drug use: No   Sexual activity: Yes    Birth control/protection: None  Other Topics Concern   Not on file  Social History Narrative   Not on file   Social Determinants of Health   Financial Resource Strain: Not on file  Food Insecurity: Not on file  Transportation Needs: Not on file  Physical Activity: Not on file  Stress: Not on file  Social Connections: Not on file  Intimate Partner Violence: Not on file    Current Outpatient Medications on File Prior to Visit  Medication Sig Dispense Refill    albuterol (PROVENTIL) (2.5 MG/3ML) 0.083% nebulizer solution albuterol sulfate 2.5 mg/3 mL (0.083 %) solution for nebulization  INHALE 1 VIAL EVERY 4-6 HOURS AS NEEDED FOR WHEEZING     BREZTRI AEROSPHERE 160-9-4.8 MCG/ACT AERO TAKE 1 PUFF BY MOUTH EVERY DAY     citalopram (CELEXA) 20 MG tablet Take 30 mg by mouth daily.     dapagliflozin propanediol (FARXIGA) 10 MG TABS tablet Take 1 tablet (10 mg total) by mouth daily before breakfast. 90 tablet 3   desvenlafaxine (PRISTIQ) 100 MG 24 hr tablet Take 100 mg by mouth daily.     DUPIXENT 300 MG/2ML prefilled syringe Inject into the skin.     ezetimibe (ZETIA) 10 MG tablet Take 10 mg by mouth daily.     Fluticasone-Umeclidin-Vilant (TRELEGY ELLIPTA) 200-62.5-25 MCG/INH AEPB Inhale 1 puff into the lungs daily. 28 each 3   glucose blood (CONTOUR NEXT TEST) test strip 1 each by Other route 2 (two) times daily. And lancets 2/day (Patient taking differently: 1 each by Other route once a week. And lancets 2/day) 100 each 12   hydrocortisone cream 1 % Apply topically 2 (two) times daily.     ibuprofen (ADVIL,MOTRIN) 600 MG tablet  Take 600 mg by mouth every 6 (six) hours as needed.     ipratropium-albuterol (DUONEB) 0.5-2.5 (3) MG/3ML SOLN ipratropium-albuterol 0.5 mg-3 mg(2.5 mg base)/3 mL nebulization soln  INHALE 1 VIAL EVERY 6 HOURS AS NEEDED FOR WHEEZING     levocetirizine (XYZAL) 5 MG tablet Take 5 mg by mouth every evening.     lisinopril (PRINIVIL,ZESTRIL) 5 MG tablet Take 5 mg by mouth daily.  0   montelukast (SINGULAIR) 10 MG tablet Take 10 mg by mouth daily.     mupirocin cream (BACTROBAN) 2 % Apply 1 application topically 2 (two) times daily.     nystatin cream (MYCOSTATIN) Apply topically 2 (two) times daily.     rosuvastatin (CRESTOR) 5 MG tablet Take 1 tablet (5 mg total) by mouth daily. 90 tablet 3   triamcinolone cream (KENALOG) 0.1 % Apply 1 fingertip amount to affected area twice daily 30 g 2   No current facility-administered  medications on file prior to visit.    Allergies  Allergen Reactions   Azithromycin     "hives"   Penicillins    Sulfa Antibiotics    Keflex [Cephalexin] Rash    Family History  Problem Relation Age of Onset   Eczema Mother    Asthma Mother    Allergic rhinitis Mother    Eczema Father    Diabetes Father    Allergic rhinitis Sister    Allergic rhinitis Sister    Diabetes Other    Cancer Other    Coronary artery disease Other    Asthma Other     BP 140/84 (BP Location: Right Arm, Patient Position: Sitting, Cuff Size: Normal)   Pulse 75   Ht 5\' 5"  (1.651 m)   Wt 194 lb 6.4 oz (88.2 kg)   LMP 06/25/2012   SpO2 97%   BMI 32.35 kg/m    Review of Systems Denies nausea.      Objective:   Physical Exam Pulses: dorsalis pedis intact bilat.   MSK: no deformity of the feet CV: no leg edema. Skin:  no ulcer on the feet.  normal color and temp on the feet.   Neuro: sensation is intact to touch on the feet.     Lab Results  Component Value Date   HGBA1C 8.1 (A) 09/22/2021      Assessment & Plan:  Type 2 DM: uncontrolled.  I have sent a prescription to your pharmacy, to re-try the Rybelsus.

## 2021-10-03 DIAGNOSIS — F411 Generalized anxiety disorder: Secondary | ICD-10-CM | POA: Diagnosis not present

## 2021-10-03 DIAGNOSIS — F319 Bipolar disorder, unspecified: Secondary | ICD-10-CM | POA: Diagnosis not present

## 2021-10-07 DIAGNOSIS — Z6831 Body mass index (BMI) 31.0-31.9, adult: Secondary | ICD-10-CM | POA: Diagnosis not present

## 2021-10-07 DIAGNOSIS — J449 Chronic obstructive pulmonary disease, unspecified: Secondary | ICD-10-CM | POA: Diagnosis not present

## 2021-10-07 DIAGNOSIS — Z789 Other specified health status: Secondary | ICD-10-CM | POA: Diagnosis not present

## 2021-10-07 DIAGNOSIS — F324 Major depressive disorder, single episode, in partial remission: Secondary | ICD-10-CM | POA: Diagnosis not present

## 2021-10-17 DIAGNOSIS — F319 Bipolar disorder, unspecified: Secondary | ICD-10-CM | POA: Diagnosis not present

## 2021-10-17 DIAGNOSIS — F411 Generalized anxiety disorder: Secondary | ICD-10-CM | POA: Diagnosis not present

## 2021-10-27 ENCOUNTER — Other Ambulatory Visit: Payer: Self-pay

## 2021-10-27 ENCOUNTER — Ambulatory Visit: Payer: BC Managed Care – PPO | Admitting: Allergy

## 2021-10-27 ENCOUNTER — Encounter: Payer: Self-pay | Admitting: Allergy

## 2021-10-27 VITALS — BP 130/70 | HR 75 | Temp 98.0°F | Resp 16 | Ht 65.0 in | Wt 189.0 lb

## 2021-10-27 DIAGNOSIS — J449 Chronic obstructive pulmonary disease, unspecified: Secondary | ICD-10-CM

## 2021-10-27 DIAGNOSIS — L2084 Intrinsic (allergic) eczema: Secondary | ICD-10-CM | POA: Diagnosis not present

## 2021-10-27 DIAGNOSIS — H1013 Acute atopic conjunctivitis, bilateral: Secondary | ICD-10-CM

## 2021-10-27 DIAGNOSIS — L301 Dyshidrosis [pompholyx]: Secondary | ICD-10-CM | POA: Diagnosis not present

## 2021-10-27 DIAGNOSIS — J3089 Other allergic rhinitis: Secondary | ICD-10-CM

## 2021-10-27 NOTE — Progress Notes (Signed)
Follow-up Note  RE: Monica Todd MRN: [id number] DOB: 24-Aug-1971 Date of Office Visit: 10/27/2021   History of present illness: Monica Todd is a 50 y.o. female presenting today for follow-up of asthma with COPD, allergic rhinitis with conjunctivitis and atopic dermatitis with dyshidrotic eczema component.  She was last seen in the office on 07/22/2021 by myself.  She is doing much better.  Her eczema is under much better control.  She is quite pleased with how Dupixent has been working for her.  She states she hasn't open sores on her feet in 1.5-2 months now.  She is also noting less rash on her arms.  She has not needed to use her triamcinolone or hydrocortisone nearly as much.  She has not needed to use the Bactroban as she has not been having any open sores to use it on.  She is quite happy at this time.  She even states with her feet being healed that she can even try going to get a pedicure.  With her asthma/COPD she states since starting Dupixent she has not needed to use her albuterol at all.  She is not having any daytime or nighttime symptoms at this time.  She has not required any ED or urgent care visits or any systemic steroid needs since her last visit.  I did have her try Trelegy as did not know if she was getting any true benefit from [place] use.  She does feel that the Trelegy did control her breathing better than Breztri however while she was on the Trelegy she was having big fluctuation changes with her blood sugar control.  Thus she stopped Trelegy and is back on the [place].  With her allergies she states she does have some runny nose and some itchy eyes on occasion.  When this occurs she does take an extra dose of Xyzal and this helps.  She also continues to take Singulair daily.  Her dog did pass away just 3 days shy of trying 51 years old.  This has been quite heavy on her and she did start smoking about 3 weeks ago.  However she states she can tell a difference in  smoking now than she has previously.  She does want to quit again but just not right now.  She has some stressors at work and once these resolved she states she will be able to try again to quit.  Review of systems: Review of Systems  Constitutional: Negative.   HENT:         See HPI  Eyes:        See HPI  Respiratory: Negative.    Cardiovascular: Negative.   Gastrointestinal: Negative.   Musculoskeletal: Negative.   Skin:  Positive for itching. Negative for rash.  Neurological: Negative.    All other systems negative unless noted above in HPI  Past medical/social/surgical/family history have been reviewed and are unchanged unless specifically indicated below.  No changes  Medication List: Current Outpatient Medications  Medication Sig Dispense Refill   albuterol (PROVENTIL) (2.5 MG/3ML) 0.083% nebulizer solution albuterol sulfate 2.5 mg/3 mL (0.083 %) solution for nebulization  INHALE 1 VIAL EVERY 4-6 HOURS AS NEEDED FOR WHEEZING     BREZTRI AEROSPHERE 160-9-4.8 MCG/ACT AERO TAKE 1 PUFF BY MOUTH EVERY DAY     citalopram (CELEXA) 20 MG tablet Take 30 mg by mouth daily.     dapagliflozin propanediol (FARXIGA) 10 MG TABS tablet Take 1 tablet (10 mg total) by mouth  daily before breakfast. 90 tablet 3   desvenlafaxine (PRISTIQ) 100 MG 24 hr tablet Take 100 mg by mouth daily.     DUPIXENT 300 MG/2ML prefilled syringe Inject into the skin.     ezetimibe (ZETIA) 10 MG tablet Take 10 mg by mouth daily.     Fluticasone-Umeclidin-Vilant (TRELEGY ELLIPTA) 200-62.5-25 MCG/INH AEPB Inhale 1 puff into the lungs daily. 28 each 3   glucose blood (CONTOUR NEXT TEST) test strip 1 each by Other route 2 (two) times daily. And lancets 2/day (Patient taking differently: 1 each by Other route once a week. And lancets 2/day) 100 each 12   hydrocortisone cream 1 % Apply topically 2 (two) times daily.     ibuprofen (ADVIL,MOTRIN) 600 MG tablet Take 600 mg by mouth every 6 (six) hours as needed.      ipratropium-albuterol (DUONEB) 0.5-2.5 (3) MG/3ML SOLN ipratropium-albuterol 0.5 mg-3 mg(2.5 mg base)/3 mL nebulization soln  INHALE 1 VIAL EVERY 6 HOURS AS NEEDED FOR WHEEZING     levocetirizine (XYZAL) 5 MG tablet Take 5 mg by mouth every evening.     lisinopril (PRINIVIL,ZESTRIL) 5 MG tablet Take 5 mg by mouth daily.  0   metFORMIN (GLUCOPHAGE-XR) 500 MG 24 hr tablet Take 4 tablets (2,000 mg total) by mouth daily. 360 tablet 3   montelukast (SINGULAIR) 10 MG tablet Take 10 mg by mouth daily.     mupirocin cream (BACTROBAN) 2 % Apply 1 application topically 2 (two) times daily.     nystatin cream (MYCOSTATIN) Apply topically 2 (two) times daily.     rosuvastatin (CRESTOR) 5 MG tablet Take 1 tablet (5 mg total) by mouth daily. 90 tablet 3   Semaglutide (RYBELSUS) 3 MG TABS Take 3 mg by mouth daily. 30 tablet 11   triamcinolone cream (KENALOG) 0.1 % Apply 1 fingertip amount to affected area twice daily 30 g 2   No current facility-administered medications for this visit.     Known medication allergies: Allergies  Allergen Reactions   Azithromycin     "hives"   Penicillins    Sulfa Antibiotics    Keflex [Cephalexin] Rash    Physical examination: Blood pressure 130/70, pulse 75, temperature 98 F (36.7 C), temperature source Temporal, resp. rate 16, height 5\' 5"  (1.651 m), weight 189 lb (85.7 kg), last menstrual period 06/25/2012, SpO2 98 %.  General: Alert, interactive, in no acute distress. HEENT: PERRLA, TMs pearly gray, turbinates non-edematous without discharge, post-pharynx non erythematous. Neck: Supple without lymphadenopathy. Lungs: Clear to auscultation without wheezing, rhonchi or rales. {no increased work of breathing. CV: Normal S1, S2 without murmurs. Abdomen: Nondistended, nontender. Skin: Warm and dry, without lesions or rashes. Extremities:  No clubbing, cyanosis or edema. Neuro:   Grossly intact.  Diagnositics/Labs:  Spirometry: FEV1: 1.77L 63%, FVC: 3.1L 88%  predicted.  This is a much improved study from previous  Assessment and plan:   Asthma with COPD -At this time under good control -Have access to albuterol inhaler 2 puffs every 4-6 hours as needed for cough/wheeze/shortness of breath/chest tightness.  May use 15-20 minutes prior to activity.   Monitor frequency of use.   -continue Breztri 2 puffs twice a day -Continue singular 10 mg daily -Continue Dupixent injections every 2 weeks self-administered  Control goals:  Full participation in all desired activities (may need albuterol before activity) Albuterol use two time or less a week on average (not counting use with activity) Cough interfering with sleep two time or less a month Oral steroids  no more than once a year No hospitalizations  Environmental allergies -Continue avoidance measures for dust mites, cat, dog, grass pollen, tree pollen, weed pollen, cockroach, mold -Continue levocetirizine 5 mg daily (can take additional dose if needed) -Continue Singulair as above  -For itchy, watery eyes can use over-the-counter Pataday 1 drop each eye daily as needed -You are now eligible for allergen immunotherapy if medication management is ineffective in allergy symptom control  Atopic dermatitis with dyshidrotic eczema -Much improved!!!! -Continue moisturization after bathing with thick emollient -Continue triamcinolone twice a day as needed for more moderate to severe eczema flares and hydrocortisone twice a day for mild flares -Use Bactroban for open skin areas to prevent secondary infection -Continue Dupixent injections every 2 weeks  Follow-up in 6 months or sooner if needed predicted.    I appreciate the opportunity to take part in Jillene's care. Please do not hesitate to contact me with questions.  Sincerely,   Margo Aye, MD Allergy/Immunology [facility]

## 2021-10-27 NOTE — Patient Instructions (Addendum)
Asthma with COPD -At this time under good control -Have access to albuterol inhaler 2 puffs every 4-6 hours as needed for cough/wheeze/shortness of breath/chest tightness.  May use 15-20 minutes prior to activity.   Monitor frequency of use.   -continue Breztri 2 puffs twice a day -Continue singular 10 mg daily -Continue Dupixent injections every 2 weeks self-administered  Control goals:  Full participation in all desired activities (may need albuterol before activity) Albuterol use two time or less a week on average (not counting use with activity) Cough interfering with sleep two time or less a month Oral steroids no more than once a year No hospitalizations  Environmental allergies -Continue avoidance measures for dust mites, cat, dog, grass pollen, tree pollen, weed pollen, cockroach, mold -Continue levocetirizine 5 mg daily (can take additional dose if needed) -Continue Singulair as above  -For itchy, watery eyes can use over-the-counter Pataday 1 drop each eye daily as needed -You are now eligible for allergen immunotherapy if medication management is ineffective in allergy symptom control  Atopic dermatitis with dyshidrotic eczema -Much improved!!!! -Continue moisturization after bathing with thick emollient -Continue triamcinolone twice a day as needed for more moderate to severe eczema flares and hydrocortisone twice a day for mild flares -Use Bactroban for open skin areas to prevent secondary infection -Continue Dupixent injections every 2 weeks  Follow-up in 6 months or sooner if needed predicted.

## 2021-10-28 DIAGNOSIS — E118 Type 2 diabetes mellitus with unspecified complications: Secondary | ICD-10-CM | POA: Diagnosis not present

## 2021-10-28 DIAGNOSIS — E782 Mixed hyperlipidemia: Secondary | ICD-10-CM | POA: Diagnosis not present

## 2021-10-28 DIAGNOSIS — J449 Chronic obstructive pulmonary disease, unspecified: Secondary | ICD-10-CM | POA: Diagnosis not present

## 2021-10-28 DIAGNOSIS — F324 Major depressive disorder, single episode, in partial remission: Secondary | ICD-10-CM | POA: Diagnosis not present

## 2021-10-28 DIAGNOSIS — L301 Dyshidrosis [pompholyx]: Secondary | ICD-10-CM | POA: Diagnosis not present

## 2021-10-31 DIAGNOSIS — F319 Bipolar disorder, unspecified: Secondary | ICD-10-CM | POA: Diagnosis not present

## 2021-10-31 DIAGNOSIS — F411 Generalized anxiety disorder: Secondary | ICD-10-CM | POA: Diagnosis not present

## 2022-01-04 ENCOUNTER — Other Ambulatory Visit: Payer: Self-pay

## 2022-01-04 ENCOUNTER — Encounter: Payer: Self-pay | Admitting: Endocrinology

## 2022-01-04 ENCOUNTER — Ambulatory Visit: Payer: BC Managed Care – PPO | Admitting: Endocrinology

## 2022-01-04 VITALS — BP 144/82 | HR 69 | Ht 65.0 in | Wt 177.6 lb

## 2022-01-04 DIAGNOSIS — Z794 Long term (current) use of insulin: Secondary | ICD-10-CM | POA: Diagnosis not present

## 2022-01-04 DIAGNOSIS — E119 Type 2 diabetes mellitus without complications: Secondary | ICD-10-CM | POA: Diagnosis not present

## 2022-01-04 LAB — POCT GLYCOSYLATED HEMOGLOBIN (HGB A1C): Hemoglobin A1C: 6.6 % — AB (ref 4.0–5.6)

## 2022-01-04 NOTE — Patient Instructions (Addendum)
check your blood sugar once a day.  vary the time of day when you check, between before the 3 meals, and at bedtime.  also check if you have symptoms of your blood sugar being too high or too low.  please keep a record of the readings and bring it to your next appointment here (or you can bring the meter itself).  You can write it on any piece of paper.  please call us sooner if your blood sugar goes below 70, or if you have a lot of readings over 200.   Please continue the same 3 diabetes meds.   Please come back for a follow-up appointment in 4 months

## 2022-01-04 NOTE — Progress Notes (Signed)
Subjective:    Patient ID: Monica Todd, female    DOB: Nov 30, 1971, 51 y.o.   MRN: [id number]  HPI Pt returns for f/u of DM:  DM type: 2 Dx'ed: early [id number] Complications: PN Therapy: 3 oral meds GDM: 2003 DKA: never.  Severe hypoglycemia: never.  Pancreatitis: never.  Pancreatic imaging: never.  Other: she takes insulin only when on steroids; she also has hypertriglyceridemia; she stopped pioglitazone, due to weight gain; she did not tolerate Rybelsus (nausea).   Interval history: pt says cbg's are as in the mid-100's.  No recent steroids.  She takes meds as rx'ed.  She tolerates Rybelsus well.    Past Medical History:  Diagnosis Date   Asthma    Bipolar 1 disorder ([place])    Depression    Eczema    Environmental allergies    High cholesterol     Past Surgical History:  Procedure Laterality Date   CESAREAN SECTION     FOOT SURGERY     HEMORRHOID SURGERY     THERAPEUTIC ABORTION      Social History   Socioeconomic History   Marital status: Divorced    Spouse name: Not on file   Number of children: Not on file   Years of education: Not on file   Highest education level: Not on file  Occupational History   Not on file  Tobacco Use   Smoking status: Every Day    Packs/day: 1.00    Types: Cigarettes    Last attempt to quit: 07/16/2018    Years since quitting: 3.4   Smokeless tobacco: Never  Vaping Use   Vaping Use: Never used  Substance and Sexual Activity   Alcohol use: Yes    Comment: rare   Drug use: No   Sexual activity: Yes    Birth control/protection: None  Other Topics Concern   Not on file  Social History Narrative   Not on file   Social Determinants of Health   Financial Resource Strain: Not on file  Food Insecurity: Not on file  Transportation Needs: Not on file  Physical Activity: Not on file  Stress: Not on file  Social Connections: Not on file  Intimate Partner Violence: Not on file    Current Outpatient Medications on File Prior to  Visit  Medication Sig Dispense Refill   albuterol (PROVENTIL) (2.5 MG/3ML) 0.083% nebulizer solution albuterol sulfate 2.5 mg/3 mL (0.083 %) solution for nebulization  INHALE 1 VIAL EVERY 4-6 HOURS AS NEEDED FOR WHEEZING     BREZTRI AEROSPHERE 160-9-4.8 MCG/ACT AERO TAKE 1 PUFF BY MOUTH EVERY DAY     citalopram (CELEXA) 20 MG tablet Take 30 mg by mouth daily.     dapagliflozin propanediol (FARXIGA) 10 MG TABS tablet Take 1 tablet (10 mg total) by mouth daily before breakfast. 90 tablet 3   desvenlafaxine (PRISTIQ) 100 MG 24 hr tablet Take 100 mg by mouth daily.     DUPIXENT 300 MG/2ML prefilled syringe Inject into the skin.     ezetimibe (ZETIA) 10 MG tablet Take 10 mg by mouth daily.     Fluticasone-Umeclidin-Vilant (TRELEGY ELLIPTA) 200-62.5-25 MCG/INH AEPB Inhale 1 puff into the lungs daily. 28 each 3   glucose blood (CONTOUR NEXT TEST) test strip 1 each by Other route 2 (two) times daily. And lancets 2/day (Patient taking differently: 1 each by Other route once a week. And lancets 2/day) 100 each 12   hydrocortisone cream 1 % Apply topically 2 (two) times daily.  ibuprofen (ADVIL,MOTRIN) 600 MG tablet Take 600 mg by mouth every 6 (six) hours as needed.     ipratropium-albuterol (DUONEB) 0.5-2.5 (3) MG/3ML SOLN ipratropium-albuterol 0.5 mg-3 mg(2.5 mg base)/3 mL nebulization soln  INHALE 1 VIAL EVERY 6 HOURS AS NEEDED FOR WHEEZING     levocetirizine (XYZAL) 5 MG tablet Take 5 mg by mouth every evening.     lisinopril (PRINIVIL,ZESTRIL) 5 MG tablet Take 5 mg by mouth daily.  0   metFORMIN (GLUCOPHAGE-XR) 500 MG 24 hr tablet Take 4 tablets (2,000 mg total) by mouth daily. 360 tablet 3   montelukast (SINGULAIR) 10 MG tablet Take 10 mg by mouth daily.     mupirocin cream (BACTROBAN) 2 % Apply 1 application topically 2 (two) times daily.     nystatin cream (MYCOSTATIN) Apply topically 2 (two) times daily.     rosuvastatin (CRESTOR) 5 MG tablet Take 1 tablet (5 mg total) by mouth daily. 90  tablet 3   Semaglutide (RYBELSUS) 3 MG TABS Take 3 mg by mouth daily. 30 tablet 11   triamcinolone cream (KENALOG) 0.1 % Apply 1 fingertip amount to affected area twice daily 30 g 2   clonazePAM (KLONOPIN) 0.5 MG tablet Take 0.5 mg by mouth 2 (two) times daily.     No current facility-administered medications on file prior to visit.    Allergies  Allergen Reactions   Azithromycin     "hives"   Penicillins    Sulfa Antibiotics    Keflex [Cephalexin] Rash    Family History  Problem Relation Age of Onset   Eczema Mother    Asthma Mother    Allergic rhinitis Mother    Eczema Father    Diabetes Father    Allergic rhinitis Sister    Allergic rhinitis Sister    Diabetes Other    Cancer Other    Coronary artery disease Other    Asthma Other     BP (!) 144/82 (BP Location: Left Arm, Patient Position: Sitting, Cuff Size: Normal)    Pulse 69    Ht 5\' 5"  (1.651 m)    Wt 177 lb 9.6 oz (80.6 kg)    LMP 06/25/2012    SpO2 96%    BMI 29.55 kg/m    Review of Systems Denies N/V/HB    Objective:   Physical Exam VITAL SIGNS:  See vs page GENERAL: no distress Pulses: dorsalis pedis intact bilat.   MSK: no deformity of the feet CV: no leg edema Skin:  no ulcer on the feet.  normal color and temp on the feet. Neuro: sensation is intact to touch on the feet   Lab Results  Component Value Date   HGBA1C 6.6 (A) 01/04/2022      Assessment & Plan:  Type 2 DM: well-controlled  Patient Instructions  check your blood sugar once a day.  vary the time of day when you check, between before the 3 meals, and at bedtime.  also check if you have symptoms of your blood sugar being too high or too low.  please keep a record of the readings and bring it to your next appointment here (or you can bring the meter itself).  You can write it on any piece of paper.  please call us sooner if your blood sugar goes below 70, or if you have a lot of readings over 200.   Please continue the same 3 diabetes  meds.   Please come back for a follow-up appointment in 4 months

## 2022-02-03 DIAGNOSIS — R21 Rash and other nonspecific skin eruption: Secondary | ICD-10-CM | POA: Diagnosis not present

## 2022-02-03 DIAGNOSIS — E785 Hyperlipidemia, unspecified: Secondary | ICD-10-CM | POA: Diagnosis not present

## 2022-02-03 DIAGNOSIS — E1169 Type 2 diabetes mellitus with other specified complication: Secondary | ICD-10-CM | POA: Diagnosis not present

## 2022-02-03 DIAGNOSIS — F324 Major depressive disorder, single episode, in partial remission: Secondary | ICD-10-CM | POA: Diagnosis not present

## 2022-02-03 DIAGNOSIS — J449 Chronic obstructive pulmonary disease, unspecified: Secondary | ICD-10-CM | POA: Diagnosis not present

## 2022-02-08 ENCOUNTER — Other Ambulatory Visit: Payer: Self-pay | Admitting: Endocrinology

## 2022-02-09 ENCOUNTER — Telehealth: Payer: Self-pay

## 2022-02-09 ENCOUNTER — Other Ambulatory Visit (HOSPITAL_COMMUNITY): Payer: Self-pay

## 2022-02-09 NOTE — Telephone Encounter (Addendum)
Patient Advocate Encounter  Prior Authorization for Rybelsus 3mg  tabs has been approved.    PA# N/A  Effective dates: 02/09/22 through 02/08/23  Spoke with Pharmacy to Process.  Patient Advocate Fax: [phone]

## 2022-02-27 DIAGNOSIS — Z5181 Encounter for therapeutic drug level monitoring: Secondary | ICD-10-CM | POA: Diagnosis not present

## 2022-02-27 DIAGNOSIS — Z7901 Long term (current) use of anticoagulants: Secondary | ICD-10-CM | POA: Diagnosis not present

## 2022-02-27 DIAGNOSIS — B368 Other specified superficial mycoses: Secondary | ICD-10-CM | POA: Diagnosis not present

## 2022-02-27 DIAGNOSIS — L270 Generalized skin eruption due to drugs and medicaments taken internally: Secondary | ICD-10-CM | POA: Diagnosis not present

## 2022-04-26 ENCOUNTER — Ambulatory Visit: Payer: BC Managed Care – PPO | Admitting: Allergy

## 2022-05-04 ENCOUNTER — Ambulatory Visit: Payer: BC Managed Care – PPO | Admitting: Endocrinology

## 2022-06-15 ENCOUNTER — Other Ambulatory Visit: Payer: Self-pay

## 2022-06-15 DIAGNOSIS — E119 Type 2 diabetes mellitus without complications: Secondary | ICD-10-CM

## 2022-06-15 MED ORDER — DAPAGLIFLOZIN PROPANEDIOL 10 MG PO TABS: 10.0000 mg | ORAL_TABLET | Freq: Every day | ORAL | 3 refills | Status: AC

## 2022-08-03 DIAGNOSIS — Z6828 Body mass index (BMI) 28.0-28.9, adult: Secondary | ICD-10-CM | POA: Diagnosis not present

## 2022-08-03 DIAGNOSIS — E1165 Type 2 diabetes mellitus with hyperglycemia: Secondary | ICD-10-CM | POA: Diagnosis not present

## 2022-08-03 DIAGNOSIS — E118 Type 2 diabetes mellitus with unspecified complications: Secondary | ICD-10-CM | POA: Diagnosis not present

## 2022-08-03 DIAGNOSIS — J441 Chronic obstructive pulmonary disease with (acute) exacerbation: Secondary | ICD-10-CM | POA: Diagnosis not present

## 2022-08-05 ENCOUNTER — Other Ambulatory Visit: Payer: Self-pay | Admitting: Allergy

## 2022-08-22 DIAGNOSIS — E785 Hyperlipidemia, unspecified: Secondary | ICD-10-CM | POA: Diagnosis not present

## 2022-08-22 DIAGNOSIS — E1169 Type 2 diabetes mellitus with other specified complication: Secondary | ICD-10-CM | POA: Diagnosis not present

## 2022-08-22 DIAGNOSIS — J441 Chronic obstructive pulmonary disease with (acute) exacerbation: Secondary | ICD-10-CM | POA: Diagnosis not present

## 2022-08-22 DIAGNOSIS — J449 Chronic obstructive pulmonary disease, unspecified: Secondary | ICD-10-CM | POA: Diagnosis not present

## 2022-09-12 DIAGNOSIS — F17200 Nicotine dependence, unspecified, uncomplicated: Secondary | ICD-10-CM | POA: Diagnosis not present

## 2022-09-12 DIAGNOSIS — I10 Essential (primary) hypertension: Secondary | ICD-10-CM | POA: Diagnosis not present

## 2022-09-12 DIAGNOSIS — J441 Chronic obstructive pulmonary disease with (acute) exacerbation: Secondary | ICD-10-CM | POA: Diagnosis not present

## 2022-09-20 DIAGNOSIS — E1165 Type 2 diabetes mellitus with hyperglycemia: Secondary | ICD-10-CM | POA: Diagnosis not present

## 2022-09-20 DIAGNOSIS — J441 Chronic obstructive pulmonary disease with (acute) exacerbation: Secondary | ICD-10-CM | POA: Diagnosis not present

## 2022-09-20 DIAGNOSIS — E118 Type 2 diabetes mellitus with unspecified complications: Secondary | ICD-10-CM | POA: Diagnosis not present

## 2022-09-20 DIAGNOSIS — Z6827 Body mass index (BMI) 27.0-27.9, adult: Secondary | ICD-10-CM | POA: Diagnosis not present

## 2022-10-05 DIAGNOSIS — J441 Chronic obstructive pulmonary disease with (acute) exacerbation: Secondary | ICD-10-CM | POA: Diagnosis not present

## 2022-10-05 DIAGNOSIS — J302 Other seasonal allergic rhinitis: Secondary | ICD-10-CM | POA: Diagnosis not present

## 2022-10-05 DIAGNOSIS — E1169 Type 2 diabetes mellitus with other specified complication: Secondary | ICD-10-CM | POA: Diagnosis not present

## 2022-10-05 DIAGNOSIS — E785 Hyperlipidemia, unspecified: Secondary | ICD-10-CM | POA: Diagnosis not present

## 2022-11-09 DIAGNOSIS — E1169 Type 2 diabetes mellitus with other specified complication: Secondary | ICD-10-CM | POA: Diagnosis not present

## 2022-11-09 DIAGNOSIS — H66001 Acute suppurative otitis media without spontaneous rupture of ear drum, right ear: Secondary | ICD-10-CM | POA: Diagnosis not present

## 2022-11-09 DIAGNOSIS — J441 Chronic obstructive pulmonary disease with (acute) exacerbation: Secondary | ICD-10-CM | POA: Diagnosis not present

## 2022-11-09 DIAGNOSIS — R197 Diarrhea, unspecified: Secondary | ICD-10-CM | POA: Diagnosis not present

## 2022-11-09 DIAGNOSIS — E785 Hyperlipidemia, unspecified: Secondary | ICD-10-CM | POA: Diagnosis not present

## 2022-11-09 DIAGNOSIS — R112 Nausea with vomiting, unspecified: Secondary | ICD-10-CM | POA: Diagnosis not present

## 2022-11-30 DIAGNOSIS — J441 Chronic obstructive pulmonary disease with (acute) exacerbation: Secondary | ICD-10-CM | POA: Diagnosis not present

## 2022-11-30 DIAGNOSIS — E1165 Type 2 diabetes mellitus with hyperglycemia: Secondary | ICD-10-CM | POA: Diagnosis not present

## 2022-11-30 DIAGNOSIS — E118 Type 2 diabetes mellitus with unspecified complications: Secondary | ICD-10-CM | POA: Diagnosis not present

## 2022-11-30 DIAGNOSIS — F324 Major depressive disorder, single episode, in partial remission: Secondary | ICD-10-CM | POA: Diagnosis not present

## 2022-12-16 DIAGNOSIS — Z880 Allergy status to penicillin: Secondary | ICD-10-CM | POA: Diagnosis not present

## 2022-12-16 DIAGNOSIS — Z7984 Long term (current) use of oral hypoglycemic drugs: Secondary | ICD-10-CM | POA: Diagnosis not present

## 2022-12-16 DIAGNOSIS — Z882 Allergy status to sulfonamides status: Secondary | ICD-10-CM | POA: Diagnosis not present

## 2022-12-16 DIAGNOSIS — F419 Anxiety disorder, unspecified: Secondary | ICD-10-CM | POA: Diagnosis not present

## 2022-12-16 DIAGNOSIS — J441 Chronic obstructive pulmonary disease with (acute) exacerbation: Secondary | ICD-10-CM | POA: Diagnosis not present

## 2022-12-16 DIAGNOSIS — J101 Influenza due to other identified influenza virus with other respiratory manifestations: Secondary | ICD-10-CM | POA: Diagnosis not present

## 2022-12-16 DIAGNOSIS — Z7951 Long term (current) use of inhaled steroids: Secondary | ICD-10-CM | POA: Diagnosis not present

## 2022-12-16 DIAGNOSIS — R000 Tachycardia, unspecified: Secondary | ICD-10-CM | POA: Diagnosis not present

## 2022-12-16 DIAGNOSIS — R9431 Abnormal electrocardiogram [ECG] [EKG]: Secondary | ICD-10-CM | POA: Diagnosis not present

## 2022-12-16 DIAGNOSIS — E119 Type 2 diabetes mellitus without complications: Secondary | ICD-10-CM | POA: Diagnosis not present

## 2022-12-16 DIAGNOSIS — M1990 Unspecified osteoarthritis, unspecified site: Secondary | ICD-10-CM | POA: Diagnosis not present

## 2022-12-16 DIAGNOSIS — R0602 Shortness of breath: Secondary | ICD-10-CM | POA: Diagnosis not present

## 2022-12-16 DIAGNOSIS — F32A Depression, unspecified: Secondary | ICD-10-CM | POA: Diagnosis not present

## 2022-12-16 DIAGNOSIS — J9601 Acute respiratory failure with hypoxia: Secondary | ICD-10-CM | POA: Diagnosis not present

## 2022-12-16 DIAGNOSIS — F17210 Nicotine dependence, cigarettes, uncomplicated: Secondary | ICD-10-CM | POA: Diagnosis not present

## 2022-12-16 DIAGNOSIS — Z79899 Other long term (current) drug therapy: Secondary | ICD-10-CM | POA: Diagnosis not present

## 2022-12-16 DIAGNOSIS — I517 Cardiomegaly: Secondary | ICD-10-CM | POA: Diagnosis not present

## 2022-12-25 DIAGNOSIS — I2510 Atherosclerotic heart disease of native coronary artery without angina pectoris: Secondary | ICD-10-CM

## 2022-12-25 HISTORY — DX: Atherosclerotic heart disease of native coronary artery without angina pectoris: I25.10

## 2022-12-27 DIAGNOSIS — E1165 Type 2 diabetes mellitus with hyperglycemia: Secondary | ICD-10-CM | POA: Diagnosis not present

## 2022-12-27 DIAGNOSIS — J9600 Acute respiratory failure, unspecified whether with hypoxia or hypercapnia: Secondary | ICD-10-CM | POA: Diagnosis not present

## 2022-12-27 DIAGNOSIS — Z6826 Body mass index (BMI) 26.0-26.9, adult: Secondary | ICD-10-CM | POA: Diagnosis not present

## 2022-12-27 DIAGNOSIS — E118 Type 2 diabetes mellitus with unspecified complications: Secondary | ICD-10-CM | POA: Diagnosis not present

## 2023-01-11 DIAGNOSIS — J9600 Acute respiratory failure, unspecified whether with hypoxia or hypercapnia: Secondary | ICD-10-CM | POA: Diagnosis not present

## 2023-01-11 DIAGNOSIS — E1165 Type 2 diabetes mellitus with hyperglycemia: Secondary | ICD-10-CM | POA: Diagnosis not present

## 2023-01-11 DIAGNOSIS — E118 Type 2 diabetes mellitus with unspecified complications: Secondary | ICD-10-CM | POA: Diagnosis not present

## 2023-01-11 DIAGNOSIS — J449 Chronic obstructive pulmonary disease, unspecified: Secondary | ICD-10-CM | POA: Diagnosis not present

## 2023-01-15 DIAGNOSIS — J449 Chronic obstructive pulmonary disease, unspecified: Secondary | ICD-10-CM | POA: Diagnosis not present

## 2023-01-15 DIAGNOSIS — G4733 Obstructive sleep apnea (adult) (pediatric): Secondary | ICD-10-CM | POA: Diagnosis not present

## 2023-01-15 DIAGNOSIS — R400 Somnolence: Secondary | ICD-10-CM | POA: Diagnosis not present

## 2023-01-15 DIAGNOSIS — J301 Allergic rhinitis due to pollen: Secondary | ICD-10-CM | POA: Diagnosis not present

## 2023-01-30 DIAGNOSIS — J301 Allergic rhinitis due to pollen: Secondary | ICD-10-CM | POA: Diagnosis not present

## 2023-01-30 DIAGNOSIS — J449 Chronic obstructive pulmonary disease, unspecified: Secondary | ICD-10-CM | POA: Diagnosis not present

## 2023-01-30 DIAGNOSIS — R400 Somnolence: Secondary | ICD-10-CM | POA: Diagnosis not present

## 2023-01-30 DIAGNOSIS — G4733 Obstructive sleep apnea (adult) (pediatric): Secondary | ICD-10-CM | POA: Diagnosis not present

## 2023-03-02 ENCOUNTER — Other Ambulatory Visit (HOSPITAL_COMMUNITY): Payer: Self-pay

## 2023-03-02 ENCOUNTER — Telehealth: Payer: Self-pay

## 2023-03-02 NOTE — Telephone Encounter (Signed)
Patient Advocate Encounter   Received notification from [facility] that prior authorization is required for Rybelsus '3MG'$  tablets  Submitted: n/a  Key [id number]  PA not submitted at this time while awaiting response from md

## 2023-03-16 NOTE — Telephone Encounter (Signed)
Message left home answering machine to call office to schedule an appointment.

## 2023-05-18 DIAGNOSIS — Z9109 Other allergy status, other than to drugs and biological substances: Secondary | ICD-10-CM | POA: Insufficient documentation

## 2023-05-18 DIAGNOSIS — L309 Dermatitis, unspecified: Secondary | ICD-10-CM | POA: Insufficient documentation

## 2023-05-18 DIAGNOSIS — E7800 Pure hypercholesterolemia, unspecified: Secondary | ICD-10-CM | POA: Insufficient documentation

## 2023-05-18 DIAGNOSIS — F319 Bipolar disorder, unspecified: Secondary | ICD-10-CM | POA: Insufficient documentation

## 2023-05-27 NOTE — Progress Notes (Unsigned)
Cardiology Office Note:    Date:  05/28/2023   ID:  Monica Todd, DOB October 31, 1971, MRN [id number]  PCP:  Buckner Malta, MD  Cardiologist:  Norman Herrlich, MD   Referring MD: Marcellus Scott, MD  ASSESSMENT:    1. Coronary artery calcification seen on CT scan   2. Pulmonary emphysema, unspecified emphysema type (HCC)   3. Type 2 diabetes mellitus without complication, with long-term current use of insulin (HCC)   4. Mixed hyperlipidemia    PLAN:    In order of problems listed above:  She has known coronary artery calcification on CT scan is having chest pain described as possible angina I think she should undergo further evaluation and we decided to do cardiac CTA to define presence or absence of CAD and guide the need potentially for coronary revascularization.  Also start aspirin 81 mg daily suspect lower coronary calcium score exceeding 300 from the description of the CT scan She is doing quite well with her lung disease no longer requires oxygen no longer smokes Stable diabetes continue treatment including metformin.  A second agent as needed GLP-1 agonist would be ideal cardioprotective standpoint Continue high intensity statin screening for LP(a) excessively drawn BMP prior to cardiac CTA  Next appointment 3 months   Medication Adjustments/Labs and Tests Ordered: Current medicines are reviewed at length with the patient today.  Concerns regarding medicines are outlined above.  No orders of the defined types were placed in this encounter.  No orders of the defined types were placed in this encounter.       History of Present Illness:    Monica Todd is a 52 y.o. female who is being seen today for the evaluation of coronary artery atherosclerosis seen on CT scan in December 2023 at the request of Marcellus Scott, MD. chart review shows no previous cardiology evaluation or diagnostic cardiac imaging.  She was seen by her primary care physician most recently 03/07/2022  she has a history of diabetes COPD and hyper lipidemia as well as sleep apnea.  She underwent CT[facility] of the chest at [facility] in December 2023 showing was described as extensive coronary artery atherosclerosis in the distribution of the left anterior descending coronary artery as well as aortic atherosclerosis.  I was unaware at the time she had the CT done that she was admitted to [facility] with respiratory failure requiring oxygen and influenza A. She has no known history of heart disease congenital rheumatic or atrial fibrillation At times she gets tightness in her chest with emotional stress relieved with rest She has been on lipid-lowering therapy for several years with a high intensity statin As her lung disease she is doing very well she is only short of breath when she pushes himself hard or exercising Wheezing or coughing has no edema orthopnea palpitation or syncope She has no exertional chest pain She tolerates her statin without muscle pain or weakness Past Medical History:  Diagnosis Date   Asthma    Bipolar 1 disorder (HCC)    Depression    Eczema    Environmental allergies    High cholesterol     Past Surgical History:  Procedure Laterality Date   CESAREAN SECTION     FOOT SURGERY     HEMORRHOID SURGERY     THERAPEUTIC ABORTION      Current Medications: Current Meds  Medication Sig   albuterol (PROVENTIL) (2.5 MG/3ML) 0.083% nebulizer solution albuterol sulfate 2.5 mg/3 mL (0.083 %) solution for nebulization  INHALE 1 VIAL EVERY 4-6 HOURS AS NEEDED FOR WHEEZING   Budeson-Glycopyrrol-Formoterol 160-9-4.8 MCG/ACT AERO Inhale 2 puffs into the lungs 2 (two) times daily.   clonazePAM (KLONOPIN) 0.5 MG tablet Take 0.5 mg by mouth 2 (two) times daily.   dapagliflozin propanediol (FARXIGA) 10 MG TABS tablet Take 1 tablet (10 mg total) by mouth daily before breakfast.   desvenlafaxine (PRISTIQ) 100 MG 24 hr tablet Take 100 mg by mouth daily.   glucose blood  (CONTOUR NEXT TEST) test strip 1 each by Other route 2 (two) times daily. And lancets 2/day (Patient taking differently: 1 each by Other route once a week. And lancets 2/day)   hydrocortisone cream 1 % Apply topically 2 (two) times daily.   ibuprofen (ADVIL,MOTRIN) 600 MG tablet Take 600 mg by mouth every 6 (six) hours as needed for mild pain, fever or headache.   ipratropium-albuterol (DUONEB) 0.5-2.5 (3) MG/3ML SOLN ipratropium-albuterol 0.5 mg-3 mg(2.5 mg base)/3 mL nebulization soln  INHALE 1 VIAL EVERY 6 HOURS AS NEEDED FOR WHEEZING   levocetirizine (XYZAL) 5 MG tablet Take 5 mg by mouth every evening.   lisinopril (PRINIVIL,ZESTRIL) 5 MG tablet Take 5 mg by mouth daily.   metFORMIN (GLUCOPHAGE-XR) 500 MG 24 hr tablet Take 4 tablets (2,000 mg total) by mouth daily. (Patient taking differently: Take 2,000 mg by mouth 2 (two) times daily with a meal.)   montelukast (SINGULAIR) 10 MG tablet Take 10 mg by mouth daily.   rosuvastatin (CRESTOR) 5 MG tablet Take 1 tablet (5 mg total) by mouth daily.     Allergies:   Azithromycin, Keflex [cephalexin], Penicillins, and Sulfa antibiotics   Social History   Socioeconomic History   Marital status: Divorced    Spouse name: Not on file   Number of children: Not on file   Years of education: Not on file   Highest education level: Not on file  Occupational History   Not on file  Tobacco Use   Smoking status: Every Day    Packs/day: 1    Types: Cigarettes    Last attempt to quit: 07/16/2018    Years since quitting: 4.8   Smokeless tobacco: Never  Vaping Use   Vaping Use: Never used  Substance and Sexual Activity   Alcohol use: Yes    Comment: rare   Drug use: No   Sexual activity: Yes    Birth control/protection: None  Other Topics Concern   Not on file  Social History Narrative   Not on file   Social Determinants of Health   Financial Resource Strain: Not on file  Food Insecurity: Not on file  Transportation Needs: Not on file   Physical Activity: Not on file  Stress: Not on file  Social Connections: Not on file     Family History: The patient's family history includes Allergic rhinitis in her mother, sister, and sister; Asthma in her mother and another family member; Cancer in an other family member; Coronary artery disease in an other family member; Diabetes in her father and another family member; Eczema in her father and mother.  ROS:   ROS Please see the history of present illness.     All other systems reviewed and are negative.  EKGs/Labs/Other Studies Reviewed:    The following studies were reviewed today:    08/22/2022 cholesterol 152 LDL 69 A1c 7.3 hemoglobin 16.1 creatinine 0.6  EKG:  EKG is  ordered today.  The ekg ordered today is personally reviewed and demonstrates sinus rhythm and  is normal  Recent Labs: No results found for requested labs within last 365 days.  Recent Lipid Panel    Component Value Date/Time   CHOL 198 01/17/2021 0920   TRIG 341.0 (H) 01/17/2021 0920   HDL 35.20 (L) 01/17/2021 0920   CHOLHDL 6 01/17/2021 0920   VLDL 68.2 (H) 01/17/2021 0920   LDLCALC 69 02/20/2020 0840   LDLDIRECT 100.0 01/17/2021 0920    Physical Exam:    VS:  BP 92/68 (BP Location: Left Arm, Patient Position: Sitting)   Pulse 71   Ht 5\' 5"  (1.651 m)   Wt 182 lb (82.6 kg)   LMP 06/25/2012   SpO2 97%   BMI 30.29 kg/m     Wt Readings from Last 3 Encounters:  05/28/23 182 lb (82.6 kg)  01/04/22 177 lb 9.6 oz (80.6 kg)  10/27/21 189 lb (85.7 kg)     GEN: well nourished, well developed in no acute distress HEENT: Normal NECK: No JVD; No carotid bruits LYMPHATICS: No lymphadenopathy CARDIAC: RRR, no murmurs, rubs, gallops RESPIRATORY:  Clear to auscultation without rales, wheezing or rhonchi  ABDOMEN: Soft, non-tender, non-distended MUSCULOSKELETAL:  No edema; No deformity  SKIN: Warm and dry NEUROLOGIC:  Alert and oriented x 3 PSYCHIATRIC:  Normal affect     Signed, Norman Herrlich, MD  05/28/2023 4:00 PM    [facility]

## 2023-05-28 ENCOUNTER — Encounter: Payer: Self-pay | Admitting: Cardiology

## 2023-05-28 ENCOUNTER — Ambulatory Visit: Payer: BC Managed Care – PPO | Attending: Cardiology | Admitting: Cardiology

## 2023-05-28 VITALS — BP 92/68 | HR 71 | Ht 65.0 in | Wt 182.0 lb

## 2023-05-28 DIAGNOSIS — Z794 Long term (current) use of insulin: Secondary | ICD-10-CM

## 2023-05-28 DIAGNOSIS — E782 Mixed hyperlipidemia: Secondary | ICD-10-CM

## 2023-05-28 DIAGNOSIS — J439 Emphysema, unspecified: Secondary | ICD-10-CM

## 2023-05-28 DIAGNOSIS — I2510 Atherosclerotic heart disease of native coronary artery without angina pectoris: Secondary | ICD-10-CM | POA: Diagnosis not present

## 2023-05-28 DIAGNOSIS — E119 Type 2 diabetes mellitus without complications: Secondary | ICD-10-CM | POA: Diagnosis not present

## 2023-05-28 DIAGNOSIS — Z7984 Long term (current) use of oral hypoglycemic drugs: Secondary | ICD-10-CM

## 2023-05-28 MED ORDER — ASPIRIN 81 MG PO TBEC: 81.0000 mg | DELAYED_RELEASE_TABLET | Freq: Every day | ORAL | 3 refills | Status: AC

## 2023-05-28 MED ORDER — METOPROLOL TARTRATE 100 MG PO TABS: 100.0000 mg | ORAL_TABLET | Freq: Once | ORAL | 0 refills | Status: DC

## 2023-05-28 NOTE — Patient Instructions (Addendum)
Medication Instructions:  Your physician has recommended you make the following change in your medication:   START: Aspirin 81 mg daily  *If you need a refill on your cardiac medications before your next appointment, please call your pharmacy*   Lab Work: Your physician recommends that you return for lab work in:   Labs 1 week before CT: BMP, LPa  If you have labs (blood work) drawn today and your tests are completely normal, you will receive your results only by: MyChart Message (if you have MyChart) OR A paper copy in the mail If you have any lab test that is abnormal or we need to change your treatment, we will call you to review the results.   Testing/Procedures:   Your cardiac CT will be scheduled at one of the below locations:   [facility] [address] [place], [place] [address] [phone]  OR  [facility] [address] [place], [place] [address] [phone]  OR   [facility] [address] [place], [place] [address] [phone]  If scheduled at [facility], please arrive at the [facility] and Children's Entrance (Entrance C2) of [facility] 30 minutes prior to test start time. You can use the FREE valet parking offered at entrance C (encouraged to control the heart rate for the test)  Proceed to the [facility] Radiology Department (first floor) to check-in and test prep.  All radiology patients and guests should use entrance C2 at [facility], accessed from [facility], even though the hospital's physical address listed is [address]    If scheduled at [facility] or [facility], please arrive 15 mins early for check-in and test prep.   Please follow these instructions carefully (unless otherwise directed):  On the Night Before the Test: Be sure to Drink plenty of  water. Do not consume any caffeinated/decaffeinated beverages or chocolate 12 hours prior to your test. Do not take any antihistamines 12 hours prior to your test.  On the Day of the Test: Drink plenty of water until 1 hour prior to the test. Do not eat any food 1 hour prior to test. You may take your regular medications prior to the test.  Take metoprolol (Lopressor) two hours prior to test. FEMALES- please wear underwire-free bra if available, avoid dresses & tight clothing      After the Test: Drink plenty of water. After receiving IV contrast, you may experience a mild flushed feeling. This is normal. On occasion, you may experience a mild rash up to 24 hours after the test. This is not dangerous. If this occurs, you can take Benadryl 25 mg and increase your fluid intake. If you experience trouble breathing, this can be serious. If it is severe call 911 IMMEDIATELY. If it is mild, please call our office. If you take any of these medications: Glipizide/Metformin, Avandament, Glucavance, please do not take 48 hours after completing test unless otherwise instructed.  We will call to schedule your test 2-4 weeks out understanding that some insurance companies will need an authorization prior to the service being performed.   For non-scheduling related questions, please contact the cardiac imaging nurse navigator should you have any questions/concerns: Rockwell Alexandria, Cardiac Imaging Nurse Navigator Larey Brick, Cardiac Imaging Nurse Navigator [facility] Direct Office Dial: [phone]   For scheduling needs, including cancellations and rescheduling, please call [place], [phone].  Follow-Up: At [facility], you and your health needs are our priority.  As part of our continuing mission to provide you with exceptional heart care, we have created designated Provider Care Teams.  These Care Teams include your primary Cardiologist (physician)  and Advanced Practice Providers (APPs -  Physician Assistants and Nurse Practitioners) who all work together to provide you with the care you need, when you need it.  We recommend signing up for the patient portal called "MyChart".  Sign up information is provided on this After Visit Summary.  MyChart is used to connect with patients for Virtual Visits (Telemedicine).  Patients are able to view lab/test results, encounter notes, upcoming appointments, etc.  Non-urgent messages can be sent to your provider as well.   To learn more about what you can do with MyChart, go to [email or web address].    Your next appointment:   3 month(s)  Provider:   Norman Herrlich, MD    Other Instructions None

## 2023-07-18 ENCOUNTER — Encounter (HOSPITAL_COMMUNITY): Payer: Self-pay

## 2023-07-18 ENCOUNTER — Telehealth (HOSPITAL_COMMUNITY): Payer: Self-pay | Admitting: Emergency Medicine

## 2023-07-18 NOTE — Telephone Encounter (Signed)
Attempted to call patient regarding upcoming cardiac CT appointment. °Left message on voicemail with name and callback number °Sara Wallace RN Navigator Cardiac Imaging °[facility] °[phone] Office °[phone] Cell ° °

## 2023-07-20 ENCOUNTER — Ambulatory Visit (HOSPITAL_COMMUNITY)
Admission: RE | Admit: 2023-07-20 | Discharge: 2023-07-20 | Disposition: A | Discharge status: Home / Self Care | Payer: BC Managed Care – PPO | Source: Ambulatory Visit | Attending: Cardiology | Admitting: Cardiology

## 2023-07-20 ENCOUNTER — Encounter (HOSPITAL_COMMUNITY): Payer: Self-pay

## 2023-07-20 ENCOUNTER — Other Ambulatory Visit: Payer: Self-pay | Admitting: Cardiology

## 2023-07-20 DIAGNOSIS — E119 Type 2 diabetes mellitus without complications: Secondary | ICD-10-CM | POA: Insufficient documentation

## 2023-07-20 DIAGNOSIS — I2510 Atherosclerotic heart disease of native coronary artery without angina pectoris: Secondary | ICD-10-CM | POA: Insufficient documentation

## 2023-07-20 DIAGNOSIS — E782 Mixed hyperlipidemia: Secondary | ICD-10-CM | POA: Diagnosis present

## 2023-07-20 DIAGNOSIS — Z794 Long term (current) use of insulin: Secondary | ICD-10-CM | POA: Insufficient documentation

## 2023-07-20 DIAGNOSIS — J439 Emphysema, unspecified: Secondary | ICD-10-CM | POA: Diagnosis present

## 2023-07-20 DIAGNOSIS — R931 Abnormal findings on diagnostic imaging of heart and coronary circulation: Secondary | ICD-10-CM | POA: Diagnosis present

## 2023-07-20 HISTORY — DX: Type 2 diabetes mellitus without complications: E11.9

## 2023-07-20 MED ORDER — IOHEXOL 350 MG/ML SOLN
100.0000 mL | Freq: Once | INTRAVENOUS | Status: AC | PRN
  Administered 2023-07-20: 100 mL via INTRAVENOUS

## 2023-07-20 MED ORDER — NITROGLYCERIN 0.4 MG SL SUBL
0.8000 mg | SUBLINGUAL_TABLET | Freq: Once | SUBLINGUAL | Status: AC
  Administered 2023-07-20: 0.8 mg via SUBLINGUAL

## 2023-07-20 MED ORDER — NITROGLYCERIN 0.4 MG SL SUBL
SUBLINGUAL_TABLET | SUBLINGUAL | Status: AC
  Filled 2023-07-20: qty 2

## 2023-07-24 ENCOUNTER — Other Ambulatory Visit: Payer: Self-pay

## 2023-07-24 MED ORDER — METOPROLOL SUCCINATE ER 25 MG PO TB24: 25.0000 mg | ORAL_TABLET | Freq: Two times a day (BID) | ORAL | 3 refills | Status: DC

## 2023-07-24 MED ORDER — NITROGLYCERIN 0.4 MG SL SUBL: 0.4000 mg | SUBLINGUAL_TABLET | SUBLINGUAL | 1 refills | Status: AC | PRN

## 2023-07-30 ENCOUNTER — Telehealth: Payer: Self-pay

## 2023-07-30 NOTE — Telephone Encounter (Signed)
No significant known medical contraindication. Metoprolol succinate [Toprol-XL] dose being started is very low at 25 mg once daily.  Safety use.  Higher doses of beta-blockers can sometimes worsen depression and should be monitored as such.   OK to start the medicine.

## 2023-07-30 NOTE — Telephone Encounter (Signed)
Called patient with the results of her radiology over read of her coronary CT. Patient stated that she had not started taking her Toprol XL, prescribed by Dr. Dulce Sellar after coronary CT,  because of her concern with how it would react with her Pristiq that she is already taking. Patient wanted to make sure there are no interactions between these medications before she starts taking the Toprol XL. Please advise

## 2023-07-31 NOTE — Telephone Encounter (Signed)
Called patient and informed her of Dr. Madireddy's recommendation below:  "No significant known medical contraindication. Metoprolol succinate [Toprol-XL] dose being started is very low at 25 mg once daily.   Safety use.  Higher doses of beta-blockers can sometimes worsen depression and should be monitored as such.   OK to start the medicine."  Patient verbalized understanding and had no further questions at this time.

## 2023-08-03 ENCOUNTER — Ambulatory Visit: Payer: BC Managed Care – PPO | Attending: Cardiology | Admitting: Cardiology

## 2023-08-03 ENCOUNTER — Encounter: Payer: Self-pay | Admitting: Cardiology

## 2023-08-03 VITALS — BP 120/82 | HR 92 | Ht 65.0 in | Wt 179.8 lb

## 2023-08-03 DIAGNOSIS — I2510 Atherosclerotic heart disease of native coronary artery without angina pectoris: Secondary | ICD-10-CM

## 2023-08-03 DIAGNOSIS — E119 Type 2 diabetes mellitus without complications: Secondary | ICD-10-CM | POA: Diagnosis not present

## 2023-08-03 DIAGNOSIS — I10 Essential (primary) hypertension: Secondary | ICD-10-CM

## 2023-08-03 DIAGNOSIS — E785 Hyperlipidemia, unspecified: Secondary | ICD-10-CM | POA: Diagnosis not present

## 2023-08-03 DIAGNOSIS — Z794 Long term (current) use of insulin: Secondary | ICD-10-CM

## 2023-08-03 MED ORDER — ISOSORBIDE MONONITRATE ER 30 MG PO TB24: 30.0000 mg | ORAL_TABLET | Freq: Every day | ORAL | 3 refills | Status: DC

## 2023-08-03 MED ORDER — METOPROLOL SUCCINATE ER 25 MG PO TB24: 25.0000 mg | ORAL_TABLET | Freq: Every day | ORAL | 3 refills | Status: DC

## 2023-08-03 NOTE — Progress Notes (Signed)
' Cardiology Office Note:  .   Date:  08/03/2023  ID:  Monica Todd, DOB 1971/10/12, MRN [id number] PCP: Buckner Malta, MD  [facility] Providers Cardiologist:  None    History of Present Illness: Monica Todd is a 52 y.o. female with a past medical history of COPD, HTN, DM 2, former tobacco abuse, hypertriglyceridemia, bipolar disorder.  Evaluated by Dr. Dulce Sellar on 05/28/2023 at the behest of her pulmonologist after having a CT which revealed coronary artery calcifications.  She was having some chest pain that was consistent with angina.  A coronary CTA was arranged and completed on 07/20/2023 revealing a calcium score of 808, 99th percentile, moderate stenosis, FFR suggested obstructive CAD in the mid RCA.  She was started on metoprolol.  She presents today to follow-up after abnormal cCTA.  She does not offer any formal complaints of chest pain, angina.  She has had episodes of shortness of breath however she feels this is related to her COPD.  We discussed her abnormal coronary CTA, indications in detail, as well as treatment options. She denies chest pain, palpitations, dyspnea, pnd, orthopnea, n, v, dizziness, syncope, edema, weight gain, or early satiety.  ROS: Review of Systems  Constitutional: Negative.   HENT: Negative.    Eyes: Negative.   Respiratory:  Positive for shortness of breath.   Cardiovascular: Negative.   Gastrointestinal: Negative.   Genitourinary: Negative.   Musculoskeletal: Negative.   Skin: Negative.   Neurological: Negative.   Endo/Heme/Allergies: Negative.   Psychiatric/Behavioral: Negative.       Studies Reviewed: .        Cardiac Studies & Procedures          CT SCANS  CT CORONARY MORPH W/CTA COR W/SCORE 07/20/2023  Addendum 07/28/2023 10:30 AM ADDENDUM REPORT: 07/28/2023 10:27  EXAM: OVER-READ INTERPRETATION  CT CHEST  The following report is an over-read performed by radiologist Dr. Marinda Elk [facility]  on 07/28/2023. This over-read does not include interpretation of cardiac or coronary anatomy or pathology. The coronary CTA interpretation by the cardiologist is attached.  COMPARISON:  CTA of the chest at [facility] on 12/17/2022  FINDINGS: The heart size is within normal limits. No pericardial fluid is identified. Visualized segments of the thoracic aorta and central pulmonary arteries are normal in caliber. Visualized mediastinum and hilar regions demonstrate no lymphadenopathy or masses. Visualized lungs show no evidence of pulmonary edema, consolidation, pneumothorax, nodule or pleural fluid. Visualized upper abdomen and bony structures are unremarkable.  IMPRESSION: No significant incidental findings.   Electronically Signed By: Irish Lack M.D. On: 07/28/2023 10:27  Narrative CLINICAL DATA:  68F with chest pain  EXAM: Cardiac/Coronary CTA  TECHNIQUE: The patient was scanned on a [employer].  FINDINGS: A 100 kV prospective scan was triggered in the descending thoracic aorta at 111 HU's. Axial non-contrast 3 mm slices were carried out through the heart. The data set was analyzed on a dedicated work station and scored using the Agatson method. Gantry rotation speed was 250 msecs and collimation was .6 mm. No beta blockade and 0.8 mg of sl NTG was given. The 3D data set was reconstructed in 5% intervals of the 35-75% of the R-R cycle. Phases were analyzed on a dedicated work station using MPR, MIP and VRT modes. The patient received 100 cc of contrast.  Coronary Arteries:  Normal coronary origin.  Right dominance.  RCA is a large dominant artery that gives rise to PDA  and PLA. Calcified plaque in ostial RCA causes 0-24% stenosis. Calcified plaque in proximal RCA causes 50-69% stenosis. Calcified plaque in mid RCA causes 50-69% stenosis  Left main is a large artery that gives rise to LAD and LCX arteries. Calcified plaque in left main causes  25-49% stenosis  LAD is a large vessel. Calcified plaque in proximal LAD causes 25-49% stenosis. Calcified plaque in mid LAD causes 25-49% stenosis. Calcified plaque in ostial D1 causes 25-49% stenosis.  LCX is a non-dominant artery that gives rise to one large OM1 branch. Calcified plaque in proximal LCX causes 0-24% stenosis  Other findings:  Left Ventricle: Normal size  Left Atrium: Mild enlargement  Pulmonary Veins: Normal configuration  Right Ventricle: Normal size  Right Atrium: Mild enlargement  Cardiac valves: No calcifications  Thoracic aorta: Normal size  Pulmonary Arteries: Normal size  Systemic Veins: Normal drainage  Pericardium: Normal thickness  IMPRESSION: 1. Coronary calcium score of 808. This was 99th percentile for age and sex matched control.  2. Total plaque volume 716mm3 which is 96th percentile for age and sex-matched controls (calcified plaque 22mm3; noncalcified plaque 573mm3). TPV is severe  3.  Normal coronary origin with right dominance.  4. Moderate (50-69%) stenosis due to calcified plaque in proximal RCA and mid RCA  5. Mild (25-49%) stenosis due to calcified plaque in left main, proximal/mid LAD, and ostial D1  6. Minimal (0-24%) stenosis due to calcified plaque in ostial RCA and proximal LCX  7.  Will send study for CTFFR  CAD-RADS 3. Moderate stenosis. Consider symptom-guided anti-ischemic pharmacotherapy as well as risk factor modification per guideline directed care. Additional analysis with CT FFR will be submitted.  Electronically Signed: By: Epifanio Lesches M.D. On: 07/20/2023 11:40          Risk Assessment/Calculations:             Physical Exam:   VS:  BP 120/82 (BP Location: Right Arm, Patient Position: Sitting, Cuff Size: Normal)   Pulse 92   Ht 5\' 5"  (1.651 m)   Wt 179 lb 12.8 oz (81.6 kg)   LMP 06/25/2012   SpO2 96%   BMI 29.92 kg/m    Wt Readings from Last 3 Encounters:  08/03/23 179 lb  12.8 oz (81.6 kg)  05/28/23 182 lb (82.6 kg)  01/04/22 177 lb 9.6 oz (80.6 kg)    GEN: Well nourished, well developed in no acute distress NECK: No JVD; No carotid bruits CARDIAC: RRR, no murmurs, rubs, gallops RESPIRATORY:  Clear to auscultation without rales, wheezing or rhonchi  ABDOMEN: Soft, non-tender, non-distended EXTREMITIES:  No edema; No deformity   ASSESSMENT AND PLAN: .   Coronary artery disease-coronary CTA revealed calcium score of 808, placing her in the 99th percentile, FFR was suggestive of obstructive CAD in the mid RCA.  We discussed medications moving forward, will start her on metoprolol succinate 25 mg once daily.  Start Imdur 30 mg daily.  Continue Aspirin 81 mg daily.  Continue Crestor 5 mg daily.  We did discuss cardiac catheterization however she is not inclined to pursue this at this time. Hyperlipidemia-currently on Crestor 5 mg daily, will repeat direct LDL and FLP if it is elevated greater than 70 will increase her Crestor. Hypertension-blood pressure well-controlled 120/82, continue lisinopril 5 mg daily. DM2-managed by her PCP, continue Glucophage 2000 mg daily, continue Farxiga 10 mg daily.  Would likely benefit from a GLP-1 as she has coronary artery disease however will defer this to her PCP in  case any other adjustments will need to be made to her other antidiabetic medications.       Dispo: Start metoprolol succinate 25 mg daily, start Imdur 30 mg daily, repeat FLP and LFTs, if LDL is greater than 70, will plan on increasing her Crestor.  Keep follow-up appointment with Dr. Dulce Sellar.  Signed, Flossie Dibble, NP

## 2023-08-03 NOTE — Patient Instructions (Signed)
Medication Instructions:  Your physician has recommended you make the following change in your medication:   START: Metoprolol succinate 25 mg daily START: Imdur 30 mg daily   *If you need a refill on your cardiac medications before your next appointment, please call your pharmacy*   Lab Work: Your physician recommends that you return for lab work in:   Labs today: LFT, Direct LDL  If you have labs (blood work) drawn today and your tests are completely normal, you will receive your results only by: MyChart Message (if you have MyChart) OR A paper copy in the mail If you have any lab test that is abnormal or we need to change your treatment, we will call you to review the results.   Testing/Procedures: None   Follow-Up: At [facility], you and your health needs are our priority.  As part of our continuing mission to provide you with exceptional heart care, we have created designated Provider Care Teams.  These Care Teams include your primary Cardiologist (physician) and Advanced Practice Providers (APPs -  Physician Assistants and Nurse Practitioners) who all work together to provide you with the care you need, when you need it.  We recommend signing up for the patient portal called "MyChart".  Sign up information is provided on this After Visit Summary.  MyChart is used to connect with patients for Virtual Visits (Telemedicine).  Patients are able to view lab/test results, encounter notes, upcoming appointments, etc.  Non-urgent messages can be sent to your provider as well.   To learn more about what you can do with MyChart, go to [email or web address].    Your next appointment:   Keep follow up appointment with Dr. Dulce Sellar  Provider:   Norman Herrlich, MD    Other Instructions Take Tylenol as needed for pain.

## 2023-08-04 LAB — LDL CHOLESTEROL, DIRECT: LDL Direct: 83 mg/dL (ref 0–99)

## 2023-08-04 LAB — HEPATIC FUNCTION PANEL
ALT: 17 IU/L (ref 0–32)
AST: 17 IU/L (ref 0–40)
Albumin: 4.5 g/dL (ref 3.8–4.9)
Alkaline Phosphatase: 66 IU/L (ref 44–121)
Bilirubin Total: <0.2 mg/dL (ref 0.0–1.2)
Bilirubin, Direct: <0.1 mg/dL (ref 0.00–0.40)
Total Protein: 6.8 g/dL (ref 6.0–8.5)

## 2023-08-06 ENCOUNTER — Telehealth: Payer: Self-pay

## 2023-08-06 DIAGNOSIS — I2510 Atherosclerotic heart disease of native coronary artery without angina pectoris: Secondary | ICD-10-CM

## 2023-08-06 DIAGNOSIS — E785 Hyperlipidemia, unspecified: Secondary | ICD-10-CM

## 2023-08-06 MED ORDER — ROSUVASTATIN CALCIUM 5 MG PO TABS: 5.0000 mg | ORAL_TABLET | Freq: Every day | ORAL | 3 refills | Status: DC

## 2023-08-06 MED ORDER — ROSUVASTATIN CALCIUM 10 MG PO TABS: 10.0000 mg | ORAL_TABLET | Freq: Every day | ORAL | 3 refills | Status: DC

## 2023-08-06 NOTE — Telephone Encounter (Signed)
-----   Message from Flossie Dibble sent at 08/06/2023  7:26 AM EDT ----- Bad cholesterol slightly too high, increase Crestor to 10 mg daily.  Repeat FLP and LFT in 8 weeks.

## 2023-08-06 NOTE — Telephone Encounter (Signed)
Results reviewed with pt as per Jennifer Woody PA's note.  Pt verbalized understanding and had no additional questions. Routed to PCP.  

## 2023-08-28 ENCOUNTER — Other Ambulatory Visit: Payer: Self-pay | Admitting: Endocrinology

## 2023-08-28 DIAGNOSIS — E119 Type 2 diabetes mellitus without complications: Secondary | ICD-10-CM

## 2023-09-05 NOTE — Progress Notes (Signed)
Cardiology Office Note:    Date:  09/10/2023   ID:  Monica Todd, DOB December 22, 1971, MRN [id number]  PCP:  Buckner Malta, MD  Cardiologist:  Norman Herrlich, MD    Referring MD: Buckner Malta, MD    ASSESSMENT:    1. Coronary artery disease of native artery of native heart with stable angina pectoris (HCC)   2. Agatston coronary artery calcium score greater than 400   3. Essential hypertension   4. Hyperlipidemia LDL goal <70   5. Type 2 diabetes mellitus without complication, with long-term current use of insulin (HCC)   6. Pulmonary emphysema, unspecified emphysema type (HCC)    PLAN:    In order of problems listed above:  Cardiac CTA revealed moderate CAD on medical therapy including aspirin oral nitrate beta-blocker and lipid-lowering having no anginal discomfort Hypertension well-controlled continue her current treatment Optimize lipid-lowering therapy high-dose statin Zetia reassess along with an LP(a) goal LDL less than 50 Stable diabetes and COPD   Next appointment: 6 months   Medication Adjustments/Labs and Tests Ordered: Current medicines are reviewed at length with the patient today.  Concerns regarding medicines are outlined above.  No orders of the defined types were placed in this encounter.  No orders of the defined types were placed in this encounter.    History of Present Illness:    Monica Todd is a 52 y.o. female with a hx of COPD with emphysema type 2 diabetes hyperlipidemia and coronary artery calcification with chest pain last seen 05/28/2023.  She subsequent underwent a cardiac CTA reported 07/20/1999 calcium score of 808/99th percentile plaque volume 96 percentile moderate CAD 50 to 69% proximal and mid right coronary artery mild 25 to 49% stenosis left main proximal and mid LAD and ostial first diagonal.  Recent LDL direct above goal at 83 LP(a) ordered but not performed.  Compliance with diet, lifestyle and medications: Yes  I again  reviewed her CTA results she had a very high coronary calcium score and moderate CAD We decided to optimize lipid-lowering therapy by taking a high dose of rosuvastatin at his Zetia recheck a fasting lipid profile along with LP(a) in 2 months. She has shortness of breath related to lung disease is can undergo allergy therapy desensitization and is not having angina edema palpitation or syncope Past Medical History:  Diagnosis Date   Asthma    Bipolar 1 disorder (HCC)    CAD (coronary artery disease) 2024   per cCTA   COPD (chronic obstructive pulmonary disease) (HCC)    Depression    Diabetes (HCC)    Type II   Eczema    Environmental allergies    High cholesterol    Tobacco use     Current Medications: Current Meds  Medication Sig   albuterol (PROVENTIL) (2.5 MG/3ML) 0.083% nebulizer solution albuterol sulfate 2.5 mg/3 mL (0.083 %) solution for nebulization  INHALE 1 VIAL EVERY 4-6 HOURS AS NEEDED FOR WHEEZING   aspirin EC 81 MG tablet Take 1 tablet (81 mg total) by mouth daily. Swallow whole.   Budeson-Glycopyrrol-Formoterol 160-9-4.8 MCG/ACT AERO Inhale 2 puffs into the lungs 2 (two) times daily.   clonazePAM (KLONOPIN) 0.5 MG tablet Take 0.5 mg by mouth 2 (two) times daily.   dapagliflozin propanediol (FARXIGA) 10 MG TABS tablet Take 1 tablet (10 mg total) by mouth daily before breakfast.   desvenlafaxine (PRISTIQ) 100 MG 24 hr tablet Take 100 mg by mouth daily.   Dupilumab (DUPIXENT ) Inject into the skin  every 14 (fourteen) days.   glucose blood (CONTOUR NEXT TEST) test strip 1 each by Other route 2 (two) times daily. And lancets 2/day   hydrocortisone cream 1 % Apply topically 2 (two) times daily.   ibuprofen (ADVIL,MOTRIN) 600 MG tablet Take 600 mg by mouth every 6 (six) hours as needed for mild pain, fever or headache.   ipratropium-albuterol (DUONEB) 0.5-2.5 (3) MG/3ML SOLN ipratropium-albuterol 0.5 mg-3 mg(2.5 mg base)/3 mL nebulization soln  INHALE 1 VIAL EVERY 6 HOURS  AS NEEDED FOR WHEEZING   isosorbide mononitrate (IMDUR) 30 MG 24 hr tablet Take 1 tablet (30 mg total) by mouth daily.   levocetirizine (XYZAL) 5 MG tablet Take 5 mg by mouth every evening.   lisinopril (PRINIVIL,ZESTRIL) 5 MG tablet Take 5 mg by mouth daily.   metFORMIN (GLUCOPHAGE-XR) 500 MG 24 hr tablet Take 4 tablets (2,000 mg total) by mouth daily.   metoprolol succinate (TOPROL XL) 25 MG 24 hr tablet Take 1 tablet (25 mg total) by mouth daily.   montelukast (SINGULAIR) 10 MG tablet Take 10 mg by mouth daily.   nitroGLYCERIN (NITROSTAT) 0.4 MG SL tablet Place 1 tablet (0.4 mg total) under the tongue every 5 (five) minutes as needed for chest pain.   rosuvastatin (CRESTOR) 10 MG tablet Take 1 tablet (10 mg total) by mouth daily.   triamcinolone cream (KENALOG) 0.5 % Apply 1 Application topically 1 day or 1 dose.      EKGs/Labs/Other Studies Reviewed:    The following studies were reviewed today:         Recent Labs: 07/20/2023: Creatinine, Ser 0.70 08/03/2023: ALT 17  Recent Lipid Panel    Component Value Date/Time   CHOL 198 01/17/2021 0920   TRIG 341.0 (H) 01/17/2021 0920   HDL 35.20 (L) 01/17/2021 0920   CHOLHDL 6 01/17/2021 0920   VLDL 68.2 (H) 01/17/2021 0920   LDLCALC 69 02/20/2020 0840   LDLDIRECT 83 08/03/2023 1448   LDLDIRECT 100.0 01/17/2021 0920    Physical Exam:    VS:  BP 122/82 (BP Location: Left Arm, Patient Position: Sitting, Cuff Size: Normal)   Pulse 62   Ht 5\' 5"  (1.651 m)   Wt 179 lb (81.2 kg)   LMP 06/25/2012   SpO2 95%   BMI 29.79 kg/m     Wt Readings from Last 3 Encounters:  09/10/23 179 lb (81.2 kg)  08/03/23 179 lb 12.8 oz (81.6 kg)  05/28/23 182 lb (82.6 kg)     GEN:  Well nourished, well developed in no acute distress HEENT: Normal NECK: No JVD; No carotid bruits LYMPHATICS: No lymphadenopathy CARDIAC: RRR, no murmurs, rubs, gallops RESPIRATORY:  Clear to auscultation without rales, wheezing or rhonchi  ABDOMEN: Soft, non-tender,  non-distended MUSCULOSKELETAL:  No edema; No deformity  SKIN: Warm and dry NEUROLOGIC:  Alert and oriented x 3 PSYCHIATRIC:  Normal affect    Signed, Norman Herrlich, MD  09/10/2023 8:54 AM    [facility]

## 2023-09-10 ENCOUNTER — Encounter: Payer: Self-pay | Admitting: Cardiology

## 2023-09-10 ENCOUNTER — Ambulatory Visit: Payer: BC Managed Care – PPO | Attending: Cardiology | Admitting: Cardiology

## 2023-09-10 VITALS — BP 122/82 | HR 62 | Ht 65.0 in | Wt 179.0 lb

## 2023-09-10 DIAGNOSIS — R931 Abnormal findings on diagnostic imaging of heart and coronary circulation: Secondary | ICD-10-CM | POA: Diagnosis not present

## 2023-09-10 DIAGNOSIS — I10 Essential (primary) hypertension: Secondary | ICD-10-CM | POA: Diagnosis not present

## 2023-09-10 DIAGNOSIS — E785 Hyperlipidemia, unspecified: Secondary | ICD-10-CM | POA: Diagnosis not present

## 2023-09-10 DIAGNOSIS — I25118 Atherosclerotic heart disease of native coronary artery with other forms of angina pectoris: Secondary | ICD-10-CM | POA: Diagnosis not present

## 2023-09-10 DIAGNOSIS — E119 Type 2 diabetes mellitus without complications: Secondary | ICD-10-CM

## 2023-09-10 DIAGNOSIS — Z794 Long term (current) use of insulin: Secondary | ICD-10-CM

## 2023-09-10 DIAGNOSIS — J439 Emphysema, unspecified: Secondary | ICD-10-CM

## 2023-09-10 MED ORDER — ROSUVASTATIN CALCIUM 20 MG PO TABS: 20.0000 mg | ORAL_TABLET | Freq: Every day | ORAL | 3 refills | Status: DC

## 2023-09-10 MED ORDER — EZETIMIBE 10 MG PO TABS: 10.0000 mg | ORAL_TABLET | Freq: Every day | ORAL | 3 refills | Status: DC

## 2023-09-10 NOTE — Patient Instructions (Addendum)
Medication Instructions:  Your physician has recommended you make the following change in your medication:   START: Zetia 10 mg daily START: Rosuvastatin 20 mg daily  *If you need a refill on your cardiac medications before your next appointment, please call your pharmacy*   Lab Work: Your physician recommends that you return for lab work in:   Labs in 4 weeks: Fasting lipid, CMP, Lpa  If you have labs (blood work) drawn today and your tests are completely normal, you will receive your results only by: MyChart Message (if you have MyChart) OR A paper copy in the mail If you have any lab test that is abnormal or we need to change your treatment, we will call you to review the results.   Testing/Procedures: None   Follow-Up: At [facility], you and your health needs are our priority.  As part of our continuing mission to provide you with exceptional heart care, we have created designated Provider Care Teams.  These Care Teams include your primary Cardiologist (physician) and Advanced Practice Providers (APPs -  Physician Assistants and Nurse Practitioners) who all work together to provide you with the care you need, when you need it.  We recommend signing up for the patient portal called "MyChart".  Sign up information is provided on this After Visit Summary.  MyChart is used to connect with patients for Virtual Visits (Telemedicine).  Patients are able to view lab/test results, encounter notes, upcoming appointments, etc.  Non-urgent messages can be sent to your provider as well.   To learn more about what you can do with MyChart, go to [email or web address].    Your next appointment:   6 month(s)  Provider:   Norman Herrlich, MD   Other Instructions None        Mediterranean diet: A heart-healthy eating plan The heart-healthy Mediterranean diet is a healthy eating plan based on typical foods and recipes of Mediterranean-style cooking. Here's how to adopt the  Mediterranean diet. By [facility] Staff  If you're looking for a heart-healthy eating plan, the Mediterranean diet might be right for you. The Mediterranean diet incorporates the basics of healthy eating -- plus a splash of flavorful olive oil and perhaps a glass of red wine -- among other components characterizing the traditional cooking style of countries bordering the [employer]. Most healthy diets include fruits, vegetables, fish and whole grains, and limit unhealthy fats. While these parts of a healthy diet are tried-and-true, subtle variations or differences in proportions of certain foods may make a difference in your risk of heart disease.  Benefits of the Mediterranean diet Research has shown that the traditional Mediterranean diet reduces the risk of heart disease. The diet has been associated with a lower level of oxidized low-density lipoprotein (LDL) cholesterol -- the "bad" cholesterol that's more likely to build up deposits in your arteries. In fact, a meta-analysis of more than 1.5 million healthy adults demonstrated that following a Mediterranean diet was associated with a reduced risk of cardiovascular mortality as well as overall mortality. The Mediterranean diet is also associated with a reduced incidence of cancer, and Parkinson's and Alzheimer's diseases. Women who eat a Mediterranean diet supplemented with extra-virgin olive oil and mixed nuts may have a reduced risk of breast cancer. For these reasons, most if not all major scientific organizations encourage healthy adults to adapt a style of eating like that of the Mediterranean diet for prevention of major chronic diseases.  Key components of the Mediterranean diet The Mediterranean  diet emphasizes: Eating primarily plant-based foods, such as fruits and vegetables, whole grains, legumes and nuts  Replacing butter with healthy fats such as olive oil and canola oil  Using herbs and spices instead of salt to flavor foods   Limiting red meat to no more than a few times a month  Eating fish and poultry at least twice a week  Enjoying meals with family and friends  Drinking red wine in moderation (optional)  Getting plenty of exercise Fruits, vegetables, nuts and grains  The Mediterranean diet traditionally includes fruits, vegetables, pasta and rice. For example, residents of [place] eat very little red meat and average nine servings a day of antioxidant-rich fruits and vegetables.  Grains in the Mediterranean region are typically whole grain and usually contain very few unhealthy trans fats, and bread is an important part of the diet there. However, throughout the Mediterranean region, bread is eaten plain or dipped in olive oil -- not eaten with butter or margarines, which contain saturated or trans fats.  Nuts are another part of a healthy Mediterranean diet. Nuts are high in fat (approximately 80 percent of their calories come from fat), but most of the fat is not saturated. Because nuts are high in calories, they should not be eaten in large amounts -- generally no more than a handful a day. Avoid candied or honey-roasted and heavily salted nuts.  Healthy fats The focus of the Mediterranean diet isn't on limiting total fat consumption, but rather to make wise choices about the types of fat you eat. The Mediterranean diet discourages saturated fats and hydrogenated oils (trans fats), both of which contribute to heart disease. The Mediterranean diet features olive oil as the primary source of fat. Olive oil provides monounsaturated fat -- a type of fat that can help reduce LDL cholesterol levels when used in place of saturated or trans fats.  "Extra-virgin" and "virgin" olive oils -- the least processed forms -- also contain the highest levels of the protective plant compounds that provide antioxidant effects. Monounsaturated fats and polyunsaturated fats, such as canola oil and some nuts, contain the beneficial  linolenic acid (a type of omega-3 fatty acid). Omega-3 fatty acids lower triglycerides, decrease blood clotting, are associated with decreased sudden heart attack, improve the health of your blood vessels, and help moderate blood pressure. Fatty fish -- such as mackerel, lake trout, herring, sardines, albacore tuna and salmon -- are rich sources of omega-3 fatty acids. Fish is eaten on a regular basis in the Mediterranean diet.  Wine The health effects of alcohol have been debated for many years, and some doctors are reluctant to encourage alcohol consumption because of the health consequences of excessive drinking. However, alcohol -- in moderation -- has been associated with a reduced risk of heart disease in some research studies. The Mediterranean diet typically includes a moderate amount of wine. This means no more than 5 ounces (148 milliliters) of wine daily for women (or men over age 13), and no more than 10 ounces (296 milliliters) of wine daily for men under age 45. If you're unable to limit your alcohol intake to the amounts defined above, if you have a personal or family history of alcohol abuse, or if you have heart or liver disease, refrain from drinking wine or any other alcohol.  Putting it all together The Mediterranean diet is a delicious and healthy way to eat. Many people who switch to this style of eating say they'll never eat any other way. Here  are some specific steps to get you started: Eat your veggies and fruits -- and switch to whole grains. An abundance and variety of plant foods should make up the majority of your meals. Strive for seven to 10 servings a day of veggies and fruits. Switch to whole-grain bread and cereal, and begin to eat more whole-grain rice and pasta products.  Go nuts. Keep almonds, cashews, pistachios and walnuts on hand for a quick snack. Choose natural peanut butter, rather than the kind with hydrogenated fat added. Try tahini (blended sesame seeds) as a  dip or spread for bread.  Pass on the butter. Try olive or canola oil as a healthy replacement for butter or margarine. Use it in cooking. Dip bread in flavored olive oil or lightly spread it on whole-grain bread for a tasty alternative to butter. Or try tahini as a dip or spread.  Spice it up. Herbs and spices make food tasty and are also rich in health-promoting substances. Season your meals with herbs and spices rather than salt.  Go fish. Eat fish once or twice a week. Fresh or water-packed tuna, salmon, trout, mackerel and herring are healthy choices. Grilled fish tastes good and requires little cleanup. Avoid fried fish, unless it's sauteed in a small amount of canola oil.  Rein in the red meat. Substitute fish and poultry for red meat. When eaten, make sure it's lean and keep portions small (about the size of a deck of cards). Also avoid sausage, bacon and other high-fat meats.  Choose low-fat dairy. Limit higher fat dairy products such as whole or 2 percent milk, cheese and ice cream. Switch to skim milk, fat-free yogurt and low-fat cheese.  Raise a glass to healthy eating. If it's OK with your doctor, have a glass of wine at dinner. If you don't drink alcohol, you don't need to start. Drinking purple grape juice may be an alternative to wine.

## 2023-11-22 LAB — COMPREHENSIVE METABOLIC PANEL
ALT: 22 [IU]/L (ref 0–32)
AST: 23 [IU]/L (ref 0–40)
Albumin: 4.1 g/dL (ref 3.8–4.9)
Alkaline Phosphatase: 63 [IU]/L (ref 44–121)
BUN/Creatinine Ratio: 13 (ref 9–23)
BUN: 10 mg/dL (ref 6–24)
Bilirubin Total: 0.3 mg/dL (ref 0.0–1.2)
CO2: 21 mmol/L (ref 20–29)
Calcium: 9.5 mg/dL (ref 8.7–10.2)
Chloride: 104 mmol/L (ref 96–106)
Creatinine, Ser: 0.75 mg/dL (ref 0.57–1.00)
Globulin, Total: 2.1 g/dL (ref 1.5–4.5)
Glucose: 132 mg/dL — ABNORMAL HIGH (ref 70–99)
Potassium: 4.4 mmol/L (ref 3.5–5.2)
Sodium: 139 mmol/L (ref 134–144)
Total Protein: 6.2 g/dL (ref 6.0–8.5)
eGFR: 95 mL/min/{1.73_m2} (ref >59)

## 2023-11-22 LAB — LIPID PANEL
Chol/HDL Ratio: 2.7 {ratio} (ref 0.0–4.4)
Cholesterol, Total: 84 mg/dL — ABNORMAL LOW (ref 100–199)
HDL: 31 mg/dL — ABNORMAL LOW (ref >39)
LDL Chol Calc (NIH): 25 mg/dL (ref 0–99)
Triglycerides: 173 mg/dL — ABNORMAL HIGH (ref 0–149)
VLDL Cholesterol Cal: 28 mg/dL (ref 5–40)

## 2023-11-22 LAB — LIPOPROTEIN A (LPA): Lipoprotein (a): <8.4 nmol/L (ref <75.0)

## 2024-05-24 ENCOUNTER — Other Ambulatory Visit: Payer: Self-pay | Admitting: Cardiology

## 2024-06-13 LAB — LAB REPORT - SCANNED
A1c: 7.7
Albumin, Urine POC: 5
Albumin/Creatinine Ratio, Urine, POC: 12
Creatinine, POC: 42.2 mg/dL
EGFR: 60

## 2024-06-30 NOTE — H&P (View-Only) (Signed)
 ' Cardiology Office Note:  .   Date:  07/03/2024  ID:  Monica Todd, DOB May 13, 1971, MRN [id number] PCP: Monica Nest, MD  [facility] Providers Cardiologist:  Monica Leiter, MD Cardiology APP:  Carlin Todd BROCKS, NP    History of Present Illness: Monica Todd is a 53 y.o. female with a past medical history of COPD, HTN, DM 2, former tobacco abuse, hypertriglyceridemia, bipolar disorder.  07/20/2023 coronary CTA calcium  score of 808, 99th percentile, moderate stenosis, FFR suggested obstructive CAD in the mid RCA.  She establish care with Dr. Leiter on 05/28/2023 at the behest of her pulmonologist after having a CT which revealed coronary artery calcifications.  She was having some chest pain that was consistent with angina.  A coronary CTA was arranged and completed on 07/20/2023 revealing a calcium  score of 808, 99th percentile, moderate stenosis, FFR suggested obstructive CAD in the mid RCA.  She was started on metoprolol .  Most recently she was evaluated by Dr. Leiter on 09/10/2023, she was stable from a cardiac perspective, LP(a) was checked which was 8.4, LDL was well-controlled at 25, she was advised to follow-up in 6 months.  She presents today for follow-up of her CAD.  She apparently ran out of her Imdur  about a month ago and has been having episodes of chest pain, they do not necessarily sound to be consistent with angina however it has mixed features.  She has been hesitant to proceed with left heart catheterization in the past preferring for medical therapy optimization.  Pain can come on with exertion or rest, typically last approximately 30 minutes, she does not take nitroglycerin  however simply the pain goes away, it can start typically pinpoint location around her rib cage and sometimes radiates to her back.  She does not have any diaphoresis, shortness of breath, dizziness when this occurs.  Did discuss that although her symptoms may not necessarily be typical for  angina she is a woman, also diabetic and smoker and so her symptoms are likely to be somewhat different than what her significant other experienced--elephant sitting on chest pressure.  Discussed with DOD Dr. Liborio, recommendations to proceed with left heart cath for further evaluation.    ROS: Review of Systems  Constitutional: Negative.   HENT: Negative.    Eyes: Negative.   Cardiovascular:  Positive for chest pain.  Gastrointestinal: Negative.   Genitourinary: Negative.   Musculoskeletal: Negative.   Skin: Negative.   Neurological: Negative.   Endo/Heme/Allergies: Negative.   Psychiatric/Behavioral: Negative.       Studies Reviewed: SABRA   EKG Interpretation Date/Time:  Wednesday July 02 2024 15:23:44 EDT Ventricular Rate:  66 PR Interval:  130 QRS Duration:  80 QT Interval:  380 QTC Calculation: 398 R Axis:   65  Text Interpretation: Normal sinus rhythm Low voltage QRS Nonspecific ST abnormality No previous ECGs available Confirmed by Carlin Todd [phone]) on 07/02/2024 3:42:50 PM    Cardiac Studies & Procedures   ______________________________________________________________________________________________          CT SCANS  CT CORONARY MORPH W/CTA COR W/SCORE 07/20/2023  Addendum 07/28/2023 10:30 AM ADDENDUM REPORT: 07/28/2023 10:27  EXAM: OVER-READ INTERPRETATION  CT CHEST  The following report is an over-read performed by radiologist Dr. Marcey Diones [facility] on 07/28/2023. This over-read does not include interpretation of cardiac or coronary anatomy or pathology. The coronary CTA interpretation by the cardiologist is attached.  COMPARISON:  CTA of the chest at [facility] on  12/17/2022  FINDINGS: The heart size is within normal limits. No pericardial fluid is identified. Visualized segments of the thoracic aorta and central pulmonary arteries are normal in caliber. Visualized mediastinum and hilar regions demonstrate no  lymphadenopathy or masses. Visualized lungs show no evidence of pulmonary edema, consolidation, pneumothorax, nodule or pleural fluid. Visualized upper abdomen and bony structures are unremarkable.  IMPRESSION: No significant incidental findings.   Electronically Signed By: [name] M.D. On: 07/28/2023 10:27  Narrative CLINICAL DATA:  68F with chest pain  EXAM: Cardiac/Coronary CTA  TECHNIQUE: The patient was scanned on a [employer].  FINDINGS: A 100 kV prospective scan was triggered in the descending thoracic aorta at 111 HU's. Axial non-contrast 3 mm slices were carried out through the heart. The data set was analyzed on a dedicated work station and scored using the Agatson method. Gantry rotation speed was 250 msecs and collimation was .6 mm. No beta blockade and 0.8 mg of sl NTG was given. The 3D data set was reconstructed in 5% intervals of the 35-75% of the R-R cycle. Phases were analyzed on a dedicated work station using MPR, MIP and VRT modes. The patient received 100 cc of contrast.  Coronary Arteries:  Normal coronary origin.  Right dominance.  RCA is a large dominant artery that gives rise to PDA and PLA. Calcified plaque in ostial RCA causes 0-24% stenosis. Calcified plaque in proximal RCA causes 50-69% stenosis. Calcified plaque in mid RCA causes 50-69% stenosis  Left main is a large artery that gives rise to LAD and LCX arteries. Calcified plaque in left main causes 25-49% stenosis  LAD is a large vessel. Calcified plaque in proximal LAD causes 25-49% stenosis. Calcified plaque in mid LAD causes 25-49% stenosis. Calcified plaque in ostial D1 causes 25-49% stenosis.  LCX is a non-dominant artery that gives rise to one large OM1 branch. Calcified plaque in proximal LCX causes 0-24% stenosis  Other findings:  Left Ventricle: Normal size  Left Atrium: Mild enlargement  Pulmonary Veins: Normal configuration  Right Ventricle: Normal  size  Right Atrium: Mild enlargement  Cardiac valves: No calcifications  Thoracic aorta: Normal size  Pulmonary Arteries: Normal size  Systemic Veins: Normal drainage  Pericardium: Normal thickness  IMPRESSION: 1. Coronary calcium  score of 808. This was 99th percentile for age and sex matched control.  2. Total plaque volume 774mm3 which is 96th percentile for age and sex-matched controls (calcified plaque 238mm3; noncalcified plaque 53mm3). TPV is severe  3.  Normal coronary origin with right dominance.  4. Moderate (50-69%) stenosis due to calcified plaque in proximal RCA and mid RCA  5. Mild (25-49%) stenosis due to calcified plaque in left main, proximal/mid LAD, and ostial D1  6. Minimal (0-24%) stenosis due to calcified plaque in ostial RCA and proximal LCX  7.  Will send study for CTFFR  CAD-RADS 3. Moderate stenosis. Consider symptom-guided anti-ischemic pharmacotherapy as well as risk factor modification per guideline directed care. Additional analysis with CT FFR will be submitted.  Electronically Signed: By: Lonni Nanas M.D. On: 07/20/2023 11:40     ______________________________________________________________________________________________      Risk Assessment/Calculations:             Physical Exam:   VS:  BP 114/71   Pulse 67   Ht 5' 5 (1.651 m)   Wt 171 lb (77.6 kg)   LMP 06/25/2012   SpO2 97%   BMI 28.46 kg/m    Wt Readings from Last 3 Encounters:  07/02/24 171 lb (77.6 kg)  09/10/23 179 lb (81.2 kg)  08/03/23 179 lb 12.8 oz (81.6 kg)    GEN: Well nourished, well developed in no acute distress NECK: No JVD; No carotid bruits CARDIAC: RRR, no murmurs, rubs, gallops RESPIRATORY:  Clear to auscultation without rales, wheezing or rhonchi  ABDOMEN: Soft, non-tender, non-distended EXTREMITIES:  No edema; No deformity   ASSESSMENT AND PLAN: .   Coronary artery disease-coronary CTA revealed calcium  score of 808, placing  her in the 99th percentile, FFR was suggestive of obstructive CAD in the mid RCA, although she has previously not been agreeable to proceeding with further testing, however today she has been experiencing episodes of chest pain that has mixed features, and I think we need to investigate this further.  Discussed procedure as well as potential risks including 1 in 1000 chance of stroke, 1 and 1000 chance of death, 1 and 500 chance of transient kidney failure, 1 and 200 chance of bleeding, 1 and 200 chance of allergic reaction, questions were answered to her satisfaction and she agrees to proceed.  Discussed with DOD, he evaluated her most recent coronary CTA and the decision was made to proceed with cardiac catheterization.  Will restart her Imdur  30 mg daily, continue aspirin  81 mg daily, Zetia  10 mg daily, Crestor  20 mg daily, nitroglycerin  as needed.  Hyperlipidemia -most recent LDL is well-controlled at 25, LP(a) was normal.  Continue Zetia  10 mg daily, Crestor  20 mg daily.  Hypertension-blood pressure well-controlled 114/71, continue lisinopril 5 mg daily.  DM2-managed by her PCP, most recent A1c was uncontrolled at 7.7%.     Informed Consent   Shared Decision Making/Informed Consent The risks [stroke (1 in 1000), death (1 in 1000), kidney failure [usually temporary] (1 in 500), bleeding (1 in 200), allergic reaction [possibly serious] (1 in 200)], benefits (diagnostic support and management of coronary artery disease) and alternatives of a cardiac catheterization were discussed in detail with Ms. Rambeau and she is willing to proceed.       Dispo: Restart Imdur  30 mg daily, CBC, BMET, proceed with left heart cath  Signed, Delon JAYSON Hoover, NP

## 2024-06-30 NOTE — Progress Notes (Signed)
 ' Cardiology Office Note:  .   Date:  07/03/2024  ID:  Monica Todd, DOB May 13, 1971, MRN [id number] PCP: [name], MD  [facility] Providers Cardiologist:  [name], MD Cardiology APP:  [name], NP    History of Present Illness: Monica Todd is a 53 y.o. female with a past medical history of COPD, HTN, DM 2, former tobacco abuse, hypertriglyceridemia, bipolar disorder.  07/20/2023 coronary CTA calcium  score of 808, 99th percentile, moderate stenosis, FFR suggested obstructive CAD in the mid RCA.  She establish care with Dr. Leiter on 05/28/2023 at the behest of her pulmonologist after having a CT which revealed coronary artery calcifications.  She was having some chest pain that was consistent with angina.  A coronary CTA was arranged and completed on 07/20/2023 revealing a calcium  score of 808, 99th percentile, moderate stenosis, FFR suggested obstructive CAD in the mid RCA.  She was started on metoprolol .  Most recently she was evaluated by Dr. Leiter on 09/10/2023, she was stable from a cardiac perspective, LP(a) was checked which was 8.4, LDL was well-controlled at 25, she was advised to follow-up in 6 months.  She presents today for follow-up of her CAD.  She apparently ran out of her Imdur  about a month ago and has been having episodes of chest pain, they do not necessarily sound to be consistent with angina however it has mixed features.  She has been hesitant to proceed with left heart catheterization in the past preferring for medical therapy optimization.  Pain can come on with exertion or rest, typically last approximately 30 minutes, she does not take nitroglycerin  however simply the pain goes away, it can start typically pinpoint location around her rib cage and sometimes radiates to her back.  She does not have any diaphoresis, shortness of breath, dizziness when this occurs.  Did discuss that although her symptoms may not necessarily be typical for  angina she is a woman, also diabetic and smoker and so her symptoms are likely to be somewhat different than what her significant other experienced--elephant sitting on chest pressure.  Discussed with DOD Dr. Liborio, recommendations to proceed with left heart cath for further evaluation.    ROS: Review of Systems  Constitutional: Negative.   HENT: Negative.    Eyes: Negative.   Cardiovascular:  Positive for chest pain.  Gastrointestinal: Negative.   Genitourinary: Negative.   Musculoskeletal: Negative.   Skin: Negative.   Neurological: Negative.   Endo/Heme/Allergies: Negative.   Psychiatric/Behavioral: Negative.       Studies Reviewed: SABRA   EKG Interpretation Date/Time:  Wednesday July 02 2024 15:23:44 EDT Ventricular Rate:  66 PR Interval:  130 QRS Duration:  80 QT Interval:  380 QTC Calculation: 398 R Axis:   65  Text Interpretation: Normal sinus rhythm Low voltage QRS Nonspecific ST abnormality No previous ECGs available Confirmed by Carlin Nest [phone]) on 07/02/2024 3:42:50 PM    Cardiac Studies & Procedures   ______________________________________________________________________________________________          CT SCANS  CT CORONARY MORPH W/CTA COR W/SCORE 07/20/2023  Addendum 07/28/2023 10:30 AM ADDENDUM REPORT: 07/28/2023 10:27  EXAM: OVER-READ INTERPRETATION  CT CHEST  The following report is an over-read performed by radiologist Dr. Marcey Diones [facility] on 07/28/2023. This over-read does not include interpretation of cardiac or coronary anatomy or pathology. The coronary CTA interpretation by the cardiologist is attached.  COMPARISON:  CTA of the chest at [facility] on  12/17/2022  FINDINGS: The heart size is within normal limits. No pericardial fluid is identified. Visualized segments of the thoracic aorta and central pulmonary arteries are normal in caliber. Visualized mediastinum and hilar regions demonstrate no  lymphadenopathy or masses. Visualized lungs show no evidence of pulmonary edema, consolidation, pneumothorax, nodule or pleural fluid. Visualized upper abdomen and bony structures are unremarkable.  IMPRESSION: No significant incidental findings.   Electronically Signed By: [name] M.D. On: 07/28/2023 10:27  Narrative CLINICAL DATA:  68F with chest pain  EXAM: Cardiac/Coronary CTA  TECHNIQUE: The patient was scanned on a [employer].  FINDINGS: A 100 kV prospective scan was triggered in the descending thoracic aorta at 111 HU's. Axial non-contrast 3 mm slices were carried out through the heart. The data set was analyzed on a dedicated work station and scored using the Agatson method. Gantry rotation speed was 250 msecs and collimation was .6 mm. No beta blockade and 0.8 mg of sl NTG was given. The 3D data set was reconstructed in 5% intervals of the 35-75% of the R-R cycle. Phases were analyzed on a dedicated work station using MPR, MIP and VRT modes. The patient received 100 cc of contrast.  Coronary Arteries:  Normal coronary origin.  Right dominance.  RCA is a large dominant artery that gives rise to PDA and PLA. Calcified plaque in ostial RCA causes 0-24% stenosis. Calcified plaque in proximal RCA causes 50-69% stenosis. Calcified plaque in mid RCA causes 50-69% stenosis  Left main is a large artery that gives rise to LAD and LCX arteries. Calcified plaque in left main causes 25-49% stenosis  LAD is a large vessel. Calcified plaque in proximal LAD causes 25-49% stenosis. Calcified plaque in mid LAD causes 25-49% stenosis. Calcified plaque in ostial D1 causes 25-49% stenosis.  LCX is a non-dominant artery that gives rise to one large OM1 branch. Calcified plaque in proximal LCX causes 0-24% stenosis  Other findings:  Left Ventricle: Normal size  Left Atrium: Mild enlargement  Pulmonary Veins: Normal configuration  Right Ventricle: Normal  size  Right Atrium: Mild enlargement  Cardiac valves: No calcifications  Thoracic aorta: Normal size  Pulmonary Arteries: Normal size  Systemic Veins: Normal drainage  Pericardium: Normal thickness  IMPRESSION: 1. Coronary calcium  score of 808. This was 99th percentile for age and sex matched control.  2. Total plaque volume 774mm3 which is 96th percentile for age and sex-matched controls (calcified plaque 238mm3; noncalcified plaque 53mm3). TPV is severe  3.  Normal coronary origin with right dominance.  4. Moderate (50-69%) stenosis due to calcified plaque in proximal RCA and mid RCA  5. Mild (25-49%) stenosis due to calcified plaque in left main, proximal/mid LAD, and ostial D1  6. Minimal (0-24%) stenosis due to calcified plaque in ostial RCA and proximal LCX  7.  Will send study for CTFFR  CAD-RADS 3. Moderate stenosis. Consider symptom-guided anti-ischemic pharmacotherapy as well as risk factor modification per guideline directed care. Additional analysis with CT FFR will be submitted.  Electronically Signed: By: Lonni Nanas M.D. On: 07/20/2023 11:40     ______________________________________________________________________________________________      Risk Assessment/Calculations:             Physical Exam:   VS:  BP 114/71   Pulse 67   Ht 5' 5 (1.651 m)   Wt 171 lb (77.6 kg)   LMP 06/25/2012   SpO2 97%   BMI 28.46 kg/m    Wt Readings from Last 3 Encounters:  07/02/24 171 lb (77.6 kg)  09/10/23 179 lb (81.2 kg)  08/03/23 179 lb 12.8 oz (81.6 kg)    GEN: Well nourished, well developed in no acute distress NECK: No JVD; No carotid bruits CARDIAC: RRR, no murmurs, rubs, gallops RESPIRATORY:  Clear to auscultation without rales, wheezing or rhonchi  ABDOMEN: Soft, non-tender, non-distended EXTREMITIES:  No edema; No deformity   ASSESSMENT AND PLAN: .   Coronary artery disease-coronary CTA revealed calcium  score of 808, placing  her in the 99th percentile, FFR was suggestive of obstructive CAD in the mid RCA, although she has previously not been agreeable to proceeding with further testing, however today she has been experiencing episodes of chest pain that has mixed features, and I think we need to investigate this further.  Discussed procedure as well as potential risks including 1 in 1000 chance of stroke, 1 and 1000 chance of death, 1 and 500 chance of transient kidney failure, 1 and 200 chance of bleeding, 1 and 200 chance of allergic reaction, questions were answered to her satisfaction and she agrees to proceed.  Discussed with DOD, he evaluated her most recent coronary CTA and the decision was made to proceed with cardiac catheterization.  Will restart her Imdur  30 mg daily, continue aspirin  81 mg daily, Zetia  10 mg daily, Crestor  20 mg daily, nitroglycerin  as needed.  Hyperlipidemia -most recent LDL is well-controlled at 25, LP(a) was normal.  Continue Zetia  10 mg daily, Crestor  20 mg daily.  Hypertension-blood pressure well-controlled 114/71, continue lisinopril 5 mg daily.  DM2-managed by her PCP, most recent A1c was uncontrolled at 7.7%.     Informed Consent   Shared Decision Making/Informed Consent The risks [stroke (1 in 1000), death (1 in 1000), kidney failure [usually temporary] (1 in 500), bleeding (1 in 200), allergic reaction [possibly serious] (1 in 200)], benefits (diagnostic support and management of coronary artery disease) and alternatives of a cardiac catheterization were discussed in detail with Ms. Rambeau and she is willing to proceed.       Dispo: Restart Imdur  30 mg daily, CBC, BMET, proceed with left heart cath  Signed, Delon JAYSON Hoover, NP

## 2024-07-02 ENCOUNTER — Ambulatory Visit: Payer: Self-pay | Attending: Cardiology | Admitting: Cardiology

## 2024-07-02 ENCOUNTER — Encounter: Payer: Self-pay | Admitting: Cardiology

## 2024-07-02 VITALS — BP 114/71 | HR 67 | Ht 65.0 in | Wt 171.0 lb

## 2024-07-02 DIAGNOSIS — I10 Essential (primary) hypertension: Secondary | ICD-10-CM

## 2024-07-02 DIAGNOSIS — Z794 Long term (current) use of insulin: Secondary | ICD-10-CM

## 2024-07-02 DIAGNOSIS — E119 Type 2 diabetes mellitus without complications: Secondary | ICD-10-CM | POA: Diagnosis not present

## 2024-07-02 DIAGNOSIS — E782 Mixed hyperlipidemia: Secondary | ICD-10-CM

## 2024-07-02 DIAGNOSIS — I25118 Atherosclerotic heart disease of native coronary artery with other forms of angina pectoris: Secondary | ICD-10-CM | POA: Diagnosis not present

## 2024-07-02 MED ORDER — ISOSORBIDE MONONITRATE ER 30 MG PO TB24: 30.0000 mg | ORAL_TABLET | Freq: Every day | ORAL | 3 refills | Status: AC

## 2024-07-02 NOTE — Patient Instructions (Signed)
 Medication Instructions:  Your physician recommends that you continue on your current medications as directed. Please refer to the Current Medication list given to you today.  *If you need a refill on your cardiac medications before your next appointment, please call your pharmacy*  Lab Work: CBC, BMP  If you have labs (blood work) drawn today and your tests are completely normal, you will receive your results only by: MyChart Message (if you have MyChart) OR A paper copy in the mail If you have any lab test that is abnormal or we need to change your treatment, we will call you to review the results.  Testing/Procedures: Your physician has requested that you have a cardiac catheterization. Cardiac catheterization is used to diagnose and/or treat various heart conditions. Doctors may recommend this procedure for a number of different reasons. The most common reason is to evaluate chest pain. Chest pain can be a symptom of coronary artery disease (CAD), and cardiac catheterization can show whether plaque is narrowing or blocking your heart's arteries. This procedure is also used to evaluate the valves, as well as measure the blood flow and oxygen  levels in different parts of your heart. For further information please visit [email or web address] Please follow instruction sheet, as given.   Follow-Up: At [facility], you and your health needs are our priority.  As part of our continuing mission to provide you with exceptional heart care, our providers are all part of one team.  This team includes your primary Cardiologist (physician) and Advanced Practice Providers or APPs (Physician Assistants and Nurse Practitioners) who all work together to provide you with the care you need, when you need it.  Your next appointment:   4 week(s)  Provider:   Delon Hoover, NP [facility])     Other Instructions  Monica Todd  07/02/2024  You are scheduled for a Cardiac Catheterization on  Thursday, July 24 with Dr. Newman Lawrence.  1. Please arrive at the [facility] (Main Entrance A) at [facility]: [address] [place], [place] [address] at 7:00 AM (This time is 2 hour(s) before your procedure to ensure your preparation).   Free valet parking service is available. You will check in at ADMITTING. The support person will be asked to wait in the waiting room.  It is OK to have someone drop you off and come back when you are ready to be discharged.    Special note: Every effort is made to have your procedure done on time. Please understand that emergencies sometimes delay scheduled procedures.  2. Diet: Do not eat solid foods after midnight.  The patient may have clear liquids until 5am upon the day of the procedure.  3. Labs: You will need to have blood drawn TODAY.  You do not need to be fasting.  4. Medication instructions in preparation for your procedure:   Contrast Allergy: No   Do not take Diabetes Med Glucophage  (Metformin ) on the day of the procedure and HOLD 48 HOURS AFTER THE PROCEDURE.  On the morning of your procedure, take your Aspirin  81 mg and any morning medicines NOT listed above.  You may use sips of water.  5. Plan to go home the same day, you will only stay overnight if medically necessary. 6. Bring a current list of your medications and current insurance cards. 7. You MUST have a responsible person to drive you home. 8. Someone MUST be with you the first 24 hours after you arrive home or your discharge  will be delayed. 9. Please wear clothes that are easy to get on and off and wear slip-on shoes.  Thank you for allowing us  to care for you!   -- [facility]

## 2024-07-03 ENCOUNTER — Ambulatory Visit: Payer: Self-pay | Admitting: Cardiology

## 2024-07-03 LAB — BASIC METABOLIC PANEL WITH GFR
BUN/Creatinine Ratio: 20 (ref 9–23)
BUN: 15 mg/dL (ref 6–24)
CO2: 21 mmol/L (ref 20–29)
Calcium: 9.7 mg/dL (ref 8.7–10.2)
Chloride: 103 mmol/L (ref 96–106)
Creatinine, Ser: 0.74 mg/dL (ref 0.57–1.00)
Glucose: 200 mg/dL — ABNORMAL HIGH (ref 70–99)
Potassium: 4.5 mmol/L (ref 3.5–5.2)
Sodium: 138 mmol/L (ref 134–144)
eGFR: 96 mL/min/1.73

## 2024-07-03 LAB — CBC
Hematocrit: 44 % (ref 34.0–46.6)
Hemoglobin: 14.2 g/dL (ref 11.1–15.9)
MCH: 30.1 pg (ref 26.6–33.0)
MCHC: 32.3 g/dL (ref 31.5–35.7)
MCV: 93 fL (ref 79–97)
Platelets: 252 x10E3/uL (ref 150–450)
RBC: 4.71 x10E6/uL (ref 3.77–5.28)
RDW: 12.8 % (ref 11.7–15.4)
WBC: 13.2 x10E3/uL — ABNORMAL HIGH (ref 3.4–10.8)

## 2024-07-04 NOTE — Addendum Note (Signed)
 Addended by: CARLIN DELON BROCKS on: 07/04/2024 04:38 PM   Modules accepted: Orders

## 2024-07-15 ENCOUNTER — Telehealth: Payer: Self-pay | Admitting: *Deleted

## 2024-07-15 NOTE — Telephone Encounter (Signed)
 Cardiac Catheterization scheduled at [facility] for: Thursday July 17, 2024 9 AM Arrival time [facility] Main Entrance A at: 7 AM  Nothing to eat after midnight prior to procedure, clear liquids until 5 AM day of procedure.  Medication instructions: -Hold:  Metformin -day of procedure and 48 hours post procedure  Farxiga -AM of procedure  -Other usual morning medications can be taken with sips of water including aspirin  81 mg.  Plan to go home the same day, you will only stay overnight if medically necessary.  You must have responsible adult to drive you home.  Someone must be with you the first 24 hours after you arrive home.  Reviewed procedure instructions with patient.

## 2024-07-17 ENCOUNTER — Encounter (HOSPITAL_COMMUNITY): Payer: Self-pay | Admitting: Cardiology

## 2024-07-17 ENCOUNTER — Other Ambulatory Visit: Payer: Self-pay

## 2024-07-17 ENCOUNTER — Ambulatory Visit (HOSPITAL_COMMUNITY)
Admission: RE | Admit: 2024-07-17 | Discharge: 2024-07-17 | Disposition: A | Discharge status: Home / Self Care | Attending: Cardiology | Admitting: Cardiology

## 2024-07-17 ENCOUNTER — Encounter (HOSPITAL_COMMUNITY)
Admission: RE | Disposition: A | Discharge status: Home / Self Care | Payer: Self-pay | Source: Home / Self Care | Attending: Cardiology

## 2024-07-17 DIAGNOSIS — Z79899 Other long term (current) drug therapy: Secondary | ICD-10-CM | POA: Insufficient documentation

## 2024-07-17 DIAGNOSIS — E781 Pure hyperglyceridemia: Secondary | ICD-10-CM | POA: Diagnosis not present

## 2024-07-17 DIAGNOSIS — I2510 Atherosclerotic heart disease of native coronary artery without angina pectoris: Secondary | ICD-10-CM | POA: Diagnosis present

## 2024-07-17 DIAGNOSIS — Z794 Long term (current) use of insulin: Secondary | ICD-10-CM

## 2024-07-17 DIAGNOSIS — Z87891 Personal history of nicotine dependence: Secondary | ICD-10-CM | POA: Diagnosis not present

## 2024-07-17 DIAGNOSIS — I10 Essential (primary) hypertension: Secondary | ICD-10-CM | POA: Insufficient documentation

## 2024-07-17 DIAGNOSIS — E119 Type 2 diabetes mellitus without complications: Secondary | ICD-10-CM | POA: Insufficient documentation

## 2024-07-17 DIAGNOSIS — G5622 Lesion of ulnar nerve, left upper limb: Secondary | ICD-10-CM | POA: Insufficient documentation

## 2024-07-17 DIAGNOSIS — F319 Bipolar disorder, unspecified: Secondary | ICD-10-CM | POA: Insufficient documentation

## 2024-07-17 DIAGNOSIS — I25118 Atherosclerotic heart disease of native coronary artery with other forms of angina pectoris: Secondary | ICD-10-CM

## 2024-07-17 DIAGNOSIS — E782 Mixed hyperlipidemia: Secondary | ICD-10-CM

## 2024-07-17 DIAGNOSIS — J449 Chronic obstructive pulmonary disease, unspecified: Secondary | ICD-10-CM | POA: Diagnosis not present

## 2024-07-17 HISTORY — PX: LEFT HEART CATH AND CORONARY ANGIOGRAPHY: CATH118249

## 2024-07-17 LAB — GLUCOSE, CAPILLARY
Glucose-Capillary: 150 mg/dL — ABNORMAL HIGH (ref 70–99)
Glucose-Capillary: 154 mg/dL — ABNORMAL HIGH (ref 70–99)

## 2024-07-17 SURGERY — LEFT HEART CATH AND CORONARY ANGIOGRAPHY
Anesthesia: LOCAL

## 2024-07-17 MED ORDER — SODIUM CHLORIDE 0.9 % WEIGHT BASED INFUSION: 3.0000 mL/kg/h | INTRAVENOUS | Status: AC

## 2024-07-17 MED ORDER — SODIUM CHLORIDE 0.9% FLUSH
3.0000 mL | INTRAVENOUS | Status: DC | PRN
Start: 2024-07-17 — End: 2024-07-17

## 2024-07-17 MED ORDER — FENTANYL CITRATE (PF) 100 MCG/2ML IJ SOLN
INTRAMUSCULAR | Status: DC | PRN
  Administered 2024-07-17: 25 ug via INTRAVENOUS

## 2024-07-17 MED ORDER — HEPARIN SODIUM (PORCINE) 1000 UNIT/ML IJ SOLN
INTRAMUSCULAR | Status: DC | PRN
  Administered 2024-07-17: 4000 [IU] via INTRAVENOUS

## 2024-07-17 MED ORDER — IOHEXOL 350 MG/ML SOLN
INTRAVENOUS | Status: DC | PRN
  Administered 2024-07-17: 15 mL

## 2024-07-17 MED ORDER — ACETAMINOPHEN 325 MG PO TABS: 650.0000 mg | ORAL_TABLET | ORAL | Status: DC | PRN

## 2024-07-17 MED ORDER — SODIUM CHLORIDE 0.9 % WEIGHT BASED INFUSION: 1.0000 mL/kg/h | INTRAVENOUS | Status: DC

## 2024-07-17 MED ORDER — FENTANYL CITRATE (PF) 100 MCG/2ML IJ SOLN
INTRAMUSCULAR | Status: AC
  Filled 2024-07-17: qty 2

## 2024-07-17 MED ORDER — VERAPAMIL HCL 2.5 MG/ML IV SOLN
INTRAVENOUS | Status: AC
  Filled 2024-07-17: qty 2

## 2024-07-17 MED ORDER — SODIUM CHLORIDE 0.9 % IV SOLN: INTRAVENOUS | Status: AC

## 2024-07-17 MED ORDER — ONDANSETRON HCL 4 MG/2ML IJ SOLN: 4.0000 mg | Freq: Four times a day (QID) | INTRAMUSCULAR | Status: DC | PRN

## 2024-07-17 MED ORDER — HEPARIN SODIUM (PORCINE) 1000 UNIT/ML IJ SOLN
INTRAMUSCULAR | Status: AC
Start: 2024-07-17 — End: 2024-07-17
  Filled 2024-07-17: qty 10

## 2024-07-17 MED ORDER — SODIUM CHLORIDE 0.9 % IV SOLN: 250.0000 mL | INTRAVENOUS | Status: DC | PRN

## 2024-07-17 MED ORDER — MIDAZOLAM HCL 2 MG/2ML IJ SOLN
INTRAMUSCULAR | Status: AC
  Filled 2024-07-17: qty 2

## 2024-07-17 MED ORDER — LIDOCAINE HCL (PF) 1 % IJ SOLN
INTRAMUSCULAR | Status: DC | PRN
  Administered 2024-07-17: 2 mL via INTRADERMAL

## 2024-07-17 MED ORDER — HYDRALAZINE HCL 20 MG/ML IJ SOLN: 10.0000 mg | INTRAMUSCULAR | Status: DC | PRN

## 2024-07-17 MED ORDER — HEPARIN (PORCINE) IN NACL 1000-0.9 UT/500ML-% IV SOLN
INTRAVENOUS | Status: DC | PRN
  Administered 2024-07-17 (×2): 500 mL

## 2024-07-17 MED ORDER — MIDAZOLAM HCL 2 MG/2ML IJ SOLN
INTRAMUSCULAR | Status: DC | PRN
  Administered 2024-07-17: 1 mg via INTRAVENOUS

## 2024-07-17 MED ORDER — LABETALOL HCL 5 MG/ML IV SOLN: 10.0000 mg | INTRAVENOUS | Status: DC | PRN

## 2024-07-17 MED ORDER — ASPIRIN 81 MG PO CHEW: 81.0000 mg | CHEWABLE_TABLET | ORAL | Status: DC

## 2024-07-17 MED ORDER — HEPARIN (PORCINE) IN NACL 2-0.9 UNITS/ML
INTRAMUSCULAR | Status: DC | PRN
  Administered 2024-07-17: 10 mL via INTRA_ARTERIAL

## 2024-07-17 MED ORDER — SODIUM CHLORIDE 0.9% FLUSH: 3.0000 mL | Freq: Two times a day (BID) | INTRAVENOUS | Status: DC

## 2024-07-17 MED ORDER — LIDOCAINE HCL (PF) 1 % IJ SOLN
INTRAMUSCULAR | Status: AC
  Filled 2024-07-17: qty 30

## 2024-07-17 SURGICAL SUPPLY — 8 items
CATH INFINITI AMBI 5FR TG (CATHETERS) IMPLANT
DEVICE RAD COMP TR BAND LRG (VASCULAR PRODUCTS) IMPLANT
GLIDESHEATH SLEND A-KIT 6F 22G (SHEATH) IMPLANT
GUIDEWIRE INQWIRE 1.5J.035X260 (WIRE) IMPLANT
KIT HEART LEFT (KITS) IMPLANT
PACK CARDIAC CATHETERIZATION (CUSTOM PROCEDURE TRAY) ×2 IMPLANT
TRANSDUCER W/STOPCOCK (MISCELLANEOUS) IMPLANT
TUBING CIL FLEX 10 FLL-RA (TUBING) IMPLANT

## 2024-07-17 NOTE — Discharge Instructions (Addendum)

## 2024-07-17 NOTE — Interval H&P Note (Signed)
 History and Physical Interval Note:  07/17/2024 10:39 AM  Monica Todd  has presented today for surgery, with the diagnosis of abnormal ct.  The various methods of treatment have been discussed with the patient and family. After consideration of risks, benefits and other options for treatment, the patient has consented to  Procedure(s): LEFT HEART CATH AND CORONARY ANGIOGRAPHY (N/A) as a surgical intervention.  The patient's history has been reviewed, patient examined, no change in status, stable for surgery.  I have reviewed the patient's chart and labs.  Questions were answered to the patient's satisfaction.     Hira Trent J Deloma Spindle

## 2024-08-13 ENCOUNTER — Telehealth: Payer: Self-pay

## 2024-08-13 NOTE — Telephone Encounter (Signed)
   Name: Monica Todd  DOB: 1971-03-03  MRN: [id number]  Primary Cardiologist: [name], MD  Chart reviewed as part of pre-operative protocol coverage. The patient has an upcoming visit scheduled with Delon Hoover, DNP on 08/26/2024 at which time clearance can be addressed in case there are any issues that would impact surgical recommendations.   LEFT CUBITAL TUNNEL RELEASE WITH ANTERIOR TRANSPOSITION, LEFT CARPAL TUNNEL RELEASE Is not scheduled until TBD as below. I added preop FYI to appointment note so that provider is aware to address at time of outpatient visit.  Per office protocol the cardiology provider should forward their finalized clearance decision and recommendations regarding antiplatelet therapy to the requesting party below.    This message will also be routed to  I will route this message as FYI to requesting party and remove this message from the preop box as separate preop APP input not needed at this time.   Please call with any questions.  Lamarr Satterfield, NP  08/13/2024, 10:45 AM

## 2024-08-13 NOTE — Telephone Encounter (Signed)
   Pre-operative Risk Assessment    Patient Name: Monica Todd  DOB: 12/04/1971 MRN: [id number]   Date of last office visit: 07/02/24 DELON HOOVER, NP Date of next office visit: 08/26/24 DELON HOOVER, NP   Request for Surgical Clearance    Procedure:  LEFT CUBITAL TUNNEL RELEASE WITH ANTERIOR TRANSPOSITION, LEFT CARPAL TUNNEL RELEASE  Date of Surgery:  Clearance TBD                                Surgeon:  DR BEBE GALLA Surgeon's Group or Practice Name:  [facility] Phone number:  [phone] Fax number:  [phone]    ATTN: JOEN WILLS   Type of Clearance Requested:   - Medical  - Pharmacy:  Hold Aspirin      Type of Anesthesia:  MAC   +  REGIONAL    Additional requests/questions:    SignedLucie DELENA Ku   08/13/2024, 9:44 AM

## 2024-08-22 NOTE — Progress Notes (Deleted)
 ' Cardiology Office Note:  .   Date:  08/22/2024  ID:  Monica Todd, DOB 1970-12-30, MRN [id number] PCP: [name], MD  [facility] Providers Cardiologist:  [name], MD Cardiology APP:  [name], NP    History of Present Illness: Monica Todd is a 53 y.o. female with a past medical history of nonobstructive CAD, COPD, HTN, DM 2, former tobacco abuse, hypertriglyceridemia, bipolar disorder.   07/17/2024 left heart cath 50% stenosis in the mid RCA 07/20/2023 coronary CTA calcium  score of 808, 99th percentile, moderate stenosis, FFR suggested obstructive CAD in the mid RCA.  She establish care with Dr. Leiter on 05/28/2023 at the behest of her pulmonologist after having a CT which revealed coronary artery calcifications.  She was having some chest pain that was consistent with angina.  A coronary CTA was arranged and completed on 07/20/2023 revealing a calcium  score of 808, 99th percentile, moderate stenosis, FFR suggested obstructive CAD in the mid RCA.  She was started on metoprolol .  Most recently she was evaluated by Dr. Leiter on 09/10/2023, she was stable from a cardiac perspective, LP(a) was checked which was 8.4, LDL was well-controlled at 25, she was advised to follow-up in 6 months.  Most recently she was evaluated by myself on 07/02/2024 for follow-up of her coronary artery disease, she had run out of her Imdur  approximately a month prior admit episodes of chest pain that had mixed features, we ultimately arranged for her to proceed with a left heart catheterization which was completed on 07/17/2024 revealing that even though her coronary CTA and FFR was positive there was only 50% focal stenosis in her mid RCA.   DL well-controlled at 19 November 2023   ROS: Review of Systems  Constitutional: Negative.   HENT: Negative.    Eyes: Negative.   Cardiovascular:  Positive for chest pain.  Gastrointestinal: Negative.   Genitourinary: Negative.    Musculoskeletal: Negative.   Skin: Negative.   Neurological: Negative.   Endo/Heme/Allergies: Negative.   Psychiatric/Behavioral: Negative.       Studies Reviewed: .        Cardiac Studies & Procedures   ______________________________________________________________________________________________ CARDIAC CATHETERIZATION  CARDIAC CATHETERIZATION 07/17/2024  Conclusion Images from the original result were not included. Coronary angiography 07/17/2024: LM: Normal LAD: Proximal vessel calcification without any significant stenosis. Lcx: Normal.  No significant disease. RCA: Mid focal 50% stenosis.  LVEDP 10 mmHg    Even though CT FFR was positive in mid RCA, stenosis is only 50% and focal.  Patient is not having any anginal symptoms on good medical therapy at this time.  I do not think she warrants stenting, and can proceed with upcoming ulnar surgery with acceptable perioperative cardiac risk.  Continue aggressive medical management for CAD with aspirin , statin, antianginal therapy, and management of risk factors.  Newman [name], MD  Findings Coronary Findings Diagnostic  Dominance: Right  Left Main Vessel is normal in caliber. Vessel is angiographically normal.  Left Anterior Descending Vessel is normal in caliber. Proximal vessel calcification without any significant stenosis  Left Circumflex Vessel is normal in caliber. Vessel is angiographically normal.  Right Coronary Artery Mid RCA lesion is 50% stenosed.  Intervention  No interventions have been documented.          CT SCANS  CT CORONARY MORPH W/CTA COR W/SCORE 07/20/2023  Addendum 07/28/2023 10:30 AM ADDENDUM REPORT: 07/28/2023 10:27  EXAM: OVER-READ INTERPRETATION  CT CHEST  The following report  is an over-read performed by radiologist Dr. Marcey Diones [facility] on 07/28/2023. This over-read does not include interpretation of cardiac or coronary anatomy or pathology. The  coronary CTA interpretation by the cardiologist is attached.  COMPARISON:  CTA of the chest at [facility] on 12/17/2022  FINDINGS: The heart size is within normal limits. No pericardial fluid is identified. Visualized segments of the thoracic aorta and central pulmonary arteries are normal in caliber. Visualized mediastinum and hilar regions demonstrate no lymphadenopathy or masses. Visualized lungs show no evidence of pulmonary edema, consolidation, pneumothorax, nodule or pleural fluid. Visualized upper abdomen and bony structures are unremarkable.  IMPRESSION: No significant incidental findings.   Electronically Signed By: [name] M.D. On: 07/28/2023 10:27  Narrative CLINICAL DATA:  37F with chest pain  EXAM: Cardiac/Coronary CTA  TECHNIQUE: The patient was scanned on a [employer].  FINDINGS: A 100 kV prospective scan was triggered in the descending thoracic aorta at 111 HU's. Axial non-contrast 3 mm slices were carried out through the heart. The data set was analyzed on a dedicated work station and scored using the Agatson method. Gantry rotation speed was 250 msecs and collimation was .6 mm. No beta blockade and 0.8 mg of sl NTG was given. The 3D data set was reconstructed in 5% intervals of the 35-75% of the R-R cycle. Phases were analyzed on a dedicated work station using MPR, MIP and VRT modes. The patient received 100 cc of contrast.  Coronary Arteries:  Normal coronary origin.  Right dominance.  RCA is a large dominant artery that gives rise to PDA and PLA. Calcified plaque in ostial RCA causes 0-24% stenosis. Calcified plaque in proximal RCA causes 50-69% stenosis. Calcified plaque in mid RCA causes 50-69% stenosis  Left main is a large artery that gives rise to LAD and LCX arteries. Calcified plaque in left main causes 25-49% stenosis  LAD is a large vessel. Calcified plaque in proximal LAD causes 25-49% stenosis. Calcified  plaque in mid LAD causes 25-49% stenosis. Calcified plaque in ostial D1 causes 25-49% stenosis.  LCX is a non-dominant artery that gives rise to one large OM1 branch. Calcified plaque in proximal LCX causes 0-24% stenosis  Other findings:  Left Ventricle: Normal size  Left Atrium: Mild enlargement  Pulmonary Veins: Normal configuration  Right Ventricle: Normal size  Right Atrium: Mild enlargement  Cardiac valves: No calcifications  Thoracic aorta: Normal size  Pulmonary Arteries: Normal size  Systemic Veins: Normal drainage  Pericardium: Normal thickness  IMPRESSION: 1. Coronary calcium  score of 808. This was 99th percentile for age and sex matched control.  2. Total plaque volume 740mm3 which is 96th percentile for age and sex-matched controls (calcified plaque 282mm3; noncalcified plaque 520mm3). TPV is severe  3.  Normal coronary origin with right dominance.  4. Moderate (50-69%) stenosis due to calcified plaque in proximal RCA and mid RCA  5. Mild (25-49%) stenosis due to calcified plaque in left main, proximal/mid LAD, and ostial D1  6. Minimal (0-24%) stenosis due to calcified plaque in ostial RCA and proximal LCX  7.  Will send study for CTFFR  CAD-RADS 3. Moderate stenosis. Consider symptom-guided anti-ischemic pharmacotherapy as well as risk factor modification per guideline directed care. Additional analysis with CT FFR will be submitted.  Electronically Signed: By: Lonni Nanas M.D. On: 07/20/2023 11:40     ______________________________________________________________________________________________      Risk Assessment/Calculations:     No BP recorded.  {Refresh Note OR Click here to  enter BP  :1}***       Physical Exam:   VS:  LMP 06/25/2012    Wt Readings from Last 3 Encounters:  07/17/24 170 lb (77.1 kg)  07/02/24 171 lb (77.6 kg)  09/10/23 179 lb (81.2 kg)    GEN: Well nourished, well developed in no acute  distress NECK: No JVD; No carotid bruits CARDIAC: RRR, no murmurs, rubs, gallops RESPIRATORY:  Clear to auscultation without rales, wheezing or rhonchi  ABDOMEN: Soft, non-tender, non-distended EXTREMITIES:  No edema; No deformity   ASSESSMENT AND PLAN: .   Coronary artery disease-   Hyperlipidemia -most recent LDL is well-controlled at 25, LP(a) was normal.  Continue Zetia  10 mg daily, Crestor  20 mg daily.  Hypertension-blood pressure well-controlled 114/71, continue lisinopril 5 mg daily.  DM2-managed by her PCP, most recent A1c was uncontrolled at 7.7%.        Dispo: Restart Imdur  30 mg daily, CBC, BMET, proceed with left heart cath  Signed, Delon JAYSON Hoover, NP

## 2024-08-23 ENCOUNTER — Other Ambulatory Visit: Payer: Self-pay | Admitting: Cardiology

## 2024-08-26 ENCOUNTER — Ambulatory Visit: Admitting: Cardiology

## 2024-08-26 DIAGNOSIS — I25118 Atherosclerotic heart disease of native coronary artery with other forms of angina pectoris: Secondary | ICD-10-CM

## 2024-08-26 DIAGNOSIS — I10 Essential (primary) hypertension: Secondary | ICD-10-CM

## 2024-08-26 DIAGNOSIS — E782 Mixed hyperlipidemia: Secondary | ICD-10-CM

## 2024-09-10 ENCOUNTER — Other Ambulatory Visit: Payer: Self-pay

## 2024-09-10 DIAGNOSIS — Z720 Tobacco use: Secondary | ICD-10-CM | POA: Insufficient documentation

## 2024-09-10 DIAGNOSIS — J449 Chronic obstructive pulmonary disease, unspecified: Secondary | ICD-10-CM | POA: Insufficient documentation

## 2024-09-11 NOTE — Progress Notes (Signed)
 Cardiology Office Note:  .   Date:  09/12/2024  ID:  Monica Todd, DOB 08/07/1971, MRN [id number] PCP: [name], MD  [facility] Providers Cardiologist:  [name], MD Cardiology APP:  [name], NP    History of Present Illness: Monica Todd is a 53 y.o. female with a past medical history of nonobstructive CAD, COPD, HTN, DM 2, former tobacco abuse, hypertriglyceridemia, bipolar disorder.  07/17/2024 left heart cath 50% stenosis in the mid RCA 07/20/2023 coronary CTA calcium  score of 808, 99th percentile, moderate stenosis, FFR suggested obstructive CAD in the mid RCA.  She established care with Dr. Leiter on 05/28/2023 at the behest of her pulmonologist after having a CT which revealed coronary artery calcifications.  She was having some chest pain that was consistent with angina.  A coronary CTA was arranged and completed on 07/20/2023 revealing a calcium  score of 808, 99th percentile, moderate stenosis, FFR suggested obstructive CAD in the mid RCA.  She was started on metoprolol .  Most recently she was evaluated by Dr. Leiter on 09/10/2023, she was stable from a cardiac perspective, LP(a) was checked which was 8.4, LDL was well-controlled at 25, she was advised to follow-up in 6 months.  Most recently she was evaluated by myself on 07/02/2024 for follow-up of her coronary artery disease, she had ran out of her Imdur  approximately a month prior admit episodes of chest pain that had mixed features, we ultimately arranged for her to proceed with a left heart catheterization which was completed on 07/17/2024 revealing that even though her coronary CTA and FFR was positive there was only 50% focal stenosis in her mid RCA.  She presents today for follow-up of her CAD and for preoperative evaluation for upcoming carpal tunnel surgery.  She has been doing well, no formal complaints from a cardiac perspective.  She has been dealing with ongoing stressors related to her  significant other, he has been in the hospital and has been under considerable amount of stress.  She denies chest pain, palpitations, dyspnea, pnd, orthopnea, n, v, dizziness, syncope, edema, weight gain, or early satiety.    ROS: Review of Systems  Constitutional: Negative.   HENT: Negative.    Eyes: Negative.   Cardiovascular:  Positive for chest pain.  Gastrointestinal: Negative.   Genitourinary: Negative.   Musculoskeletal: Negative.   Skin: Negative.   Neurological: Negative.   Endo/Heme/Allergies: Negative.   Psychiatric/Behavioral: Negative.       Studies Reviewed: SABRA   EKG Interpretation Date/Time:  Friday September 12 2024 10:33:06 EDT Ventricular Rate:  60 PR Interval:  124 QRS Duration:  68 QT Interval:  406 QTC Calculation: 406 R Axis:   63  Text Interpretation: Normal sinus rhythm Normal ECG When compared with ECG of 02-Jul-2024 15:23, No significant change was found Confirmed by Carlin Nest [phone]) on 09/12/2024 10:36:01 AM    Cardiac Studies & Procedures   ______________________________________________________________________________________________ CARDIAC CATHETERIZATION  CARDIAC CATHETERIZATION 07/17/2024  Conclusion Images from the original result were not included. Coronary angiography 07/17/2024: LM: Normal LAD: Proximal vessel calcification without any significant stenosis. Lcx: Normal.  No significant disease. RCA: Mid focal 50% stenosis.  LVEDP 10 mmHg    Even though CT FFR was positive in mid RCA, stenosis is only 50% and focal.  Patient is not having any anginal symptoms on good medical therapy at this time.  I do not think she warrants stenting, and can proceed with upcoming ulnar surgery with acceptable perioperative  cardiac risk.  Continue aggressive medical management for CAD with aspirin , statin, antianginal therapy, and management of risk factors.  Newman Monica Lawrence, MD  Findings Coronary Findings Diagnostic  Dominance:  Right  Left Main Vessel is normal in caliber. Vessel is angiographically normal.  Left Anterior Descending Vessel is normal in caliber. Proximal vessel calcification without any significant stenosis  Left Circumflex Vessel is normal in caliber. Vessel is angiographically normal.  Right Coronary Artery Mid RCA lesion is 50% stenosed.  Intervention  No interventions have been documented.          CT SCANS  CT CORONARY MORPH W/CTA COR W/SCORE 07/20/2023  Addendum 07/28/2023 10:30 AM ADDENDUM REPORT: 07/28/2023 10:27  EXAM: OVER-READ INTERPRETATION  CT CHEST  The following report is an over-read performed by radiologist Dr. Marcey Diones [facility] on 07/28/2023. This over-read does not include interpretation of cardiac or coronary anatomy or pathology. The coronary CTA interpretation by the cardiologist is attached.  COMPARISON:  CTA [facility] the chest at [facility] on 12/17/2022  FINDINGS: The heart size is within normal limits. No pericardial fluid is identified. Visualized segments of the thoracic aorta and central pulmonary arteries are normal in caliber. Visualized mediastinum and hilar regions demonstrate no lymphadenopathy or masses. Visualized lungs show no evidence of pulmonary edema, consolidation, pneumothorax, nodule or pleural fluid. Visualized upper abdomen and bony structures are unremarkable.  IMPRESSION: No significant incidental findings.   Electronically Signed By: [name] M.D. On: 07/28/2023 10:27  Narrative CLINICAL DATA:  49F with chest pain  EXAM: Cardiac/Coronary CTA  TECHNIQUE: The patient was scanned on a [employer].  FINDINGS: A 100 kV prospective scan was triggered in the descending thoracic aorta at 111 HU's. Axial non-contrast 3 mm slices were carried out through the heart. The data set was analyzed on a dedicated work station and scored using the Agatson method. Gantry rotation  speed was 250 msecs and collimation was .6 mm. No beta blockade and 0.8 mg of sl NTG was given. The 3D data set was reconstructed in 5% intervals of the 35-75% of the R-R cycle. Phases were analyzed on a dedicated work station using MPR, MIP and VRT modes. The patient received 100 cc of contrast.  Coronary Arteries:  Normal coronary origin.  Right dominance.  RCA is a large dominant artery that gives rise to PDA and PLA. Calcified plaque in ostial RCA causes 0-24% stenosis. Calcified plaque in proximal RCA causes 50-69% stenosis. Calcified plaque in mid RCA causes 50-69% stenosis  Left main is a large artery that gives rise to LAD and LCX arteries. Calcified plaque in left main causes 25-49% stenosis  LAD is a large vessel. Calcified plaque in proximal LAD causes 25-49% stenosis. Calcified plaque in mid LAD causes 25-49% stenosis. Calcified plaque in ostial D1 causes 25-49% stenosis.  LCX is a non-dominant artery that gives rise to one large OM1 branch. Calcified plaque in proximal LCX causes 0-24% stenosis  Other findings:  Left Ventricle: Normal size  Left Atrium: Mild enlargement  Pulmonary Veins: Normal configuration  Right Ventricle: Normal size  Right Atrium: Mild enlargement  Cardiac valves: No calcifications  Thoracic aorta: Normal size  Pulmonary Arteries: Normal size  Systemic Veins: Normal drainage  Pericardium: Normal thickness  IMPRESSION: 1. Coronary calcium  score of 808. This was 99th percentile for age and sex matched control.  2. Total plaque volume 785mm3 which is 96th percentile for age and sex-matched controls (calcified plaque 232mm3; noncalcified plaque 5109mm3). TPV is  severe  3.  Normal coronary origin with right dominance.  4. Moderate (50-69%) stenosis due to calcified plaque in proximal RCA and mid RCA  5. Mild (25-49%) stenosis due to calcified plaque in left main, proximal/mid LAD, and ostial D1  6. Minimal (0-24%) stenosis due  to calcified plaque in ostial RCA and proximal LCX  7.  Will send study for CTFFR  CAD-RADS 3. Moderate stenosis. Consider symptom-guided anti-ischemic pharmacotherapy as well as risk factor modification per guideline directed care. Additional analysis with CT FFR will be submitted.  Electronically Signed: By: Lonni Nanas M.D. On: 07/20/2023 11:40     ______________________________________________________________________________________________      Risk Assessment/Calculations:             Physical Exam:   VS:  BP 104/60   Pulse 60   Ht 5' 5 (1.651 m)   Wt 167 lb (75.8 kg)   LMP 06/25/2012   SpO2 95%   BMI 27.79 kg/m    Wt Readings from Last 3 Encounters:  09/12/24 167 lb (75.8 kg)  07/17/24 170 lb (77.1 kg)  07/02/24 171 lb (77.6 kg)    GEN: Well nourished, well developed in no acute distress NECK: No JVD; No carotid bruits CARDIAC: RRR, no murmurs, rubs, gallops RESPIRATORY:  Clear to auscultation without rales, wheezing or rhonchi  ABDOMEN: Soft, non-tender, non-distended EXTREMITIES:  No edema; No deformity   ASSESSMENT AND PLAN: .   Coronary artery disease-nonobstructive CAD per left heart cath.  Continue aspirin  81 mg daily, Zetia  10 mg daily, Imdur  30 mg daily, metoprolol  25 mg twice daily, nitroglycerin  as needed, Crestor  20 mg daily. Stable with no anginal symptoms. No indication for ischemic evaluation.  Heart healthy diet and regular cardiovascular exercise encouraged.    Hyperlipidemia -most recent LDL is well-controlled at 25, LP(a) was normal.  Continue Zetia  10 mg daily, Crestor  20 mg daily.  Hypertension-blood pressure well-controlled 104/60, continue lisinopril 5 mg daily.  DM2-managed by her PCP, most recent A1c was uncontrolled at 7.7%.        Dispo: Follow up in 6 months.   Signed, Delon JAYSON Hoover, NP

## 2024-09-12 ENCOUNTER — Encounter: Payer: Self-pay | Admitting: Cardiology

## 2024-09-12 ENCOUNTER — Ambulatory Visit: Attending: Cardiology | Admitting: Cardiology

## 2024-09-12 VITALS — BP 104/60 | HR 60 | Ht 65.0 in | Wt 167.0 lb

## 2024-09-12 DIAGNOSIS — Z794 Long term (current) use of insulin: Secondary | ICD-10-CM

## 2024-09-12 DIAGNOSIS — E782 Mixed hyperlipidemia: Secondary | ICD-10-CM | POA: Diagnosis not present

## 2024-09-12 DIAGNOSIS — I10 Essential (primary) hypertension: Secondary | ICD-10-CM | POA: Diagnosis not present

## 2024-09-12 DIAGNOSIS — I25118 Atherosclerotic heart disease of native coronary artery with other forms of angina pectoris: Secondary | ICD-10-CM | POA: Diagnosis not present

## 2024-09-12 DIAGNOSIS — E119 Type 2 diabetes mellitus without complications: Secondary | ICD-10-CM

## 2024-09-12 NOTE — Patient Instructions (Signed)
 Medication Instructions:   No changes   *If you need a refill on your cardiac medications before your next appointment, please call your pharmacy*  Lab Work:  None today   If you have labs (blood work) drawn today and your tests are completely normal, you will receive your results only by: MyChart Message (if you have MyChart) OR A paper copy in the mail If you have any lab test that is abnormal or we need to change your treatment, we will call you to review the results.  Testing/Procedures: None  Follow-Up: At [facility], you and your health needs are our priority.  As part of our continuing mission to provide you with exceptional heart care, our providers are all part of one team.  This team includes your primary Cardiologist (physician) and Advanced Practice Providers or APPs (Physician Assistants and Nurse Practitioners) who all work together to provide you with the care you need, when you need it.  Your next appointment:   6 month(s)  Provider:    Pierce 6 mos  DO NOT delete brackets or number around this link :1}   We recommend signing up for the patient portal called MyChart.  Sign up information is provided on this After Visit Summary.  MyChart is used to connect with patients for Virtual Visits (Telemedicine).  Patients are able to view lab/test results, encounter notes, upcoming appointments, etc.  Non-urgent messages can be sent to your provider as well.   To learn more about what you can do with MyChart, go to [email or web address].   Other Instructions

## 2024-09-18 ENCOUNTER — Other Ambulatory Visit: Payer: Self-pay | Admitting: Cardiology

## 2024-09-18 NOTE — Telephone Encounter (Signed)
 Pt is out of medication

## 2024-10-02 ENCOUNTER — Telehealth: Payer: Self-pay

## 2024-10-02 NOTE — Telephone Encounter (Signed)
   Pre-operative Risk Assessment    Patient Name: Monica Todd  DOB: Jul 16, 1971 MRN: [id number]   Date of last office visit: 09/12/24 DELON HOOVER, NP Date of next office visit: NONE   Request for Surgical Clearance    Procedure:  LEFT CUBITAL TUNNEL RELEASE WITH ANTERIOR TRANSPOSITION, LEFT CARPAL TUNNEL RELEASE  Date of Surgery:  Clearance TBD                                Surgeon:  DR BEBE BIBBER Surgeon's Group or Practice Name:  [facility] Phone number:  [phone] Fax number:  [phone]  ATTN: Monica Todd   Type of Clearance Requested:   - Medical  - Pharmacy:  Hold Aspirin      Type of Anesthesia:  REGIONAL +  MAC   Additional requests/questions:    Signed, Lucie DELENA Ku   10/02/2024, 5:45 PM

## 2024-10-03 ENCOUNTER — Telehealth: Payer: Self-pay | Admitting: *Deleted

## 2024-10-03 NOTE — Telephone Encounter (Signed)
 Delon,  You saw this patient on 09/12/2024, and mentioned she needed a pre-op clearance but did not make a note clearing her. Would you please let us  know if she is cleared for her procedure?Please send your comment to P CV Pre-Op Pool.  Thank you, Lamarr Satterfield DNP, ANP, AACC.

## 2024-10-03 NOTE — Telephone Encounter (Signed)
 ERROR: Clearance for surgery already in epic.

## 2024-10-03 NOTE — Telephone Encounter (Signed)
   Patient Name: Monica Todd  DOB: Oct 11, 1971 MRN: [id number]  Primary Cardiologist: [name], MD  Chart reviewed as part of pre-operative protocol coverage. Given past medical history and time since last visit, based on ACC/AHA guidelines, Monica Todd is at acceptable risk for the planned procedure without further cardiovascular testing.   She is able to meet greater than 4 METS of physical activity, left heart cath was reassuring and she is optimized to proceed with her carpal tunnel surgery from a cardiac perspective at an acceptable risk.   The patient was advised that if she develops new symptoms prior to surgery to contact our office to arrange for a follow-up visit, and she verbalized understanding.  I will route this recommendation to the requesting party via Epic fax function and remove from pre-op pool.  Please call with questions.  Lamarr Satterfield, NP 10/03/2024, 8:25 AM

## 2024-10-13 LAB — LAB REPORT - SCANNED: A1c: 9.6

## 2024-11-12 ENCOUNTER — Encounter: Payer: Self-pay | Admitting: Dietician

## 2024-11-12 ENCOUNTER — Encounter: Attending: Family Medicine | Admitting: Dietician

## 2024-11-12 VITALS — Wt 165.4 lb

## 2024-11-12 DIAGNOSIS — E1165 Type 2 diabetes mellitus with hyperglycemia: Secondary | ICD-10-CM | POA: Insufficient documentation

## 2024-11-12 NOTE — Patient Instructions (Signed)
 Goals Established by Patient:   Goal 1: go walking in the driveway for at least 15 minutes 3-5 days a week.   Goal 2: aim for 3 water bottles daily. Start drinking water earlier in the day.   Get vitamin b12 tested.

## 2024-11-12 NOTE — Progress Notes (Signed)
 Diabetes Self-Management Education  Visit Type: First/Initial  Appt. Start Time: 1030 Appt. End Time: 1130  11/12/2024  Monica Todd, identified by name and date of birth, is a 53 y.o. female with a diagnosis of Diabetes: Type 2.   ASSESSMENT  History includes: anxiety, asthma, COPD, depression, type 2 diabetes, HLD Labs noted: reviewed; 10/13/24: 9.6% Medications include: reviewed; farxiga , metformin ,  Supplements: MVI  Pt states she was diagnosed with T2DM in 2019. Pt last completed diabetes education in 2020.  Pt reports she works from home and is mostly sedentary throughout the day. Pt states her job is very stressful. Pt reports she paces in her driveway sometimes during breaks.   Pt smokes cigarettes. Pt reports she smokes 1 pack or less each day, taking a smoke break every 2 hrs during work.  Pt reports she is taking 2000mg  Metformin  daily for diabetes management. She has recently started Ozempic; received 2 injections. Pt states she has experienced lowered appetite and some nausea. Pt reports she was taking 2 steroids previously.   Pt states her partner had a heart attack in July 2025, which contributed to high levels of stress. Pt reports they are trying to follow a heart healthy diet. Pt states she eats red meat once per week, and is trying to add lean protein more in diet (chicken, fish, tuna salad). Pt reports that she eats vegetables such as broccoli, cauliflower, brussels sprouts at dinner.     Pt reports she checks blood glucose levels a few times a month. Pt states recent blood sugar was 109 the day prior.   Weight 165 lb 6.4 oz (75 kg), last menstrual period 06/25/2012. Body mass index is 27.52 kg/m.   Diabetes Self-Management Education - 11/12/24 1027       Visit Information   Visit Type First/Initial      Initial Visit   Diabetes Type Type 2    Date Diagnosed 2019    Are you currently following a meal plan? No    Are you taking your medications as  prescribed? Yes      Health Coping   How would you rate your overall health? Fair      Psychosocial Assessment   Patient Belief/Attitude about Diabetes Defeat/Burnout    What is the hardest part about your diabetes right now, causing you the most concern, or is the most worrisome to you about your diabetes?   Making healty food and beverage choices;Being active    Self-care barriers None    Self-management support Doctor's office    Other persons present Patient    Patient Concerns Nutrition/Meal planning;Healthy Lifestyle;Glycemic Control    Special Needs None    Preferred Learning Style No preference indicated    Learning Readiness Ready    How often do you need to have someone help you when you read instructions, pamphlets, or other written materials from your doctor or pharmacy? 1 - Never    What is the last grade level you completed in school? college      Pre-Education Assessment   Patient understands the diabetes disease and treatment process. Needs Instruction    Patient understands incorporating nutritional management into lifestyle. Needs Instruction    Patient undertands incorporating physical activity into lifestyle. Needs Instruction    Patient understands using medications safely. Needs Instruction    Patient understands monitoring blood glucose, interpreting and using results Needs Instruction    Patient understands prevention, detection, and treatment of acute complications. Needs Instruction  Patient understands prevention, detection, and treatment of chronic complications. Needs Instruction    Patient understands how to develop strategies to address psychosocial issues. Needs Instruction    Patient understands how to develop strategies to promote health/change behavior. Needs Instruction      Complications   Last HgB A1C per patient/outside source 9.6 %    Fasting Blood glucose range (mg/dL) 29-870    Have you had a dilated eye exam in the past 12 months? No     Have you had a dental exam in the past 12 months? No    Are you checking your feet? Yes    How many days per week are you checking your feet? 1      Dietary Intake   Breakfast none    Snack (morning) none    Lunch tuna salad sandwich    Snack (afternoon) none    Dinner bbq chicken and veggies    Snack (evening) nuts OR popcorn OR SF jello/pudding    Beverage(s) coffee, decaf coffee, 32 oz water      Activity / Exercise   Activity / Exercise Type ADL's      Patient Education   Previous Diabetes Education Yes   2020   Disease Pathophysiology Explored patient's options for treatment of their diabetes    Healthy Eating Role of diet in the treatment of diabetes and the relationship between the three main macronutrients and blood glucose level;Plate Method;Reviewed blood glucose goals for pre and post meals and how to evaluate the patients' food intake on their blood glucose level.;Information on hints to eating out and maintain blood glucose control.;Meal options for control of blood glucose level and chronic complications.    Being Active Role of exercise on diabetes management, blood pressure control and cardiac health.;Helped patient identify appropriate exercises in relation to his/her diabetes, diabetes complications and other health issue.    Medications Reviewed patients medication for diabetes, action, purpose, timing of dose and side effects.    Monitoring Purpose and frequency of SMBG.;Identified appropriate SMBG and/or A1C goals.    Acute complications Discussed and identified patients' prevention, symptoms, and treatment of hyperglycemia.    Chronic complications Identified and discussed with patient  current chronic complications;Lipid levels, blood glucose control and heart disease    Diabetes Stress and Support Identified and addressed patients feelings and concerns about diabetes;Role of stress on diabetes;Worked with patient to identify barriers to care and solutions;Brainstormed  with patient on coping mechanisms for social situations, getting support from significant others, dealing with feelings about diabetes    Lifestyle and Health Coping Lifestyle issues that need to be addressed for better diabetes care      Individualized Goals (developed by patient)   Nutrition General guidelines for healthy choices and portions discussed    Physical Activity Exercise 3-5 times per week;15 minutes per day    Medications take my medication as prescribed    Monitoring  Test my blood glucose as discussed    Problem Solving Eating Pattern    Reducing Risk examine blood glucose patterns;do foot checks daily;treat hypoglycemia with 15 grams of carbs if blood glucose less than 70mg /dL;stop smoking    Health Coping Ask for help with psychological, social, or emotional issues      Post-Education Assessment   Patient understands the diabetes disease and treatment process. Comprehends key points    Patient understands incorporating nutritional management into lifestyle. Comprehends key points    Patient undertands incorporating physical activity into lifestyle. Comprehends key  points    Patient understands using medications safely. Comphrehends key points    Patient understands monitoring blood glucose, interpreting and using results Comprehends key points    Patient understands prevention, detection, and treatment of acute complications. Needs Review    Patient understands prevention, detection, and treatment of chronic complications. Comprehends key points    Patient understands how to develop strategies to address psychosocial issues. Comprehends key points    Patient understands how to develop strategies to promote health/change behavior. Comprehends key points      Outcomes   Expected Outcomes Demonstrated interest in learning. Expect positive outcomes    Future DMSE 2 months    Program Status Not Completed          Individualized Plan for Diabetes Self-Management Training:    Learning Objective:  Patient will have a greater understanding of diabetes self-management. Patient education plan is to attend individual and/or group sessions per assessed needs and concerns.   Plan:   Patient Instructions  Goals Established by Patient:   Goal 1: go walking in the driveway for at least 15 minutes 3-5 days a week.   Goal 2: aim for 3 water bottles daily. Start drinking water earlier in the day.   Get vitamin b12 tested.   Expected Outcomes:  Demonstrated interest in learning. Expect positive outcomes  Education material provided: ADA - How to Thrive: A Guide for Your Journey with Diabetes and My Plate  If problems or questions, patient to contact team via:  Phone  Future DSME appointment: 2 months

## 2025-01-06 ENCOUNTER — Encounter: Payer: Self-pay | Admitting: Allergy

## 2025-01-06 ENCOUNTER — Ambulatory Visit: Admitting: Allergy

## 2025-01-06 VITALS — BP 118/74 | HR 88 | Resp 16 | Ht 65.0 in | Wt 159.4 lb

## 2025-01-06 DIAGNOSIS — J4540 Moderate persistent asthma, uncomplicated: Secondary | ICD-10-CM

## 2025-01-06 DIAGNOSIS — H1013 Acute atopic conjunctivitis, bilateral: Secondary | ICD-10-CM

## 2025-01-06 DIAGNOSIS — J302 Other seasonal allergic rhinitis: Secondary | ICD-10-CM

## 2025-01-06 DIAGNOSIS — L2084 Intrinsic (allergic) eczema: Secondary | ICD-10-CM

## 2025-01-06 DIAGNOSIS — L718 Other rosacea: Secondary | ICD-10-CM | POA: Diagnosis not present

## 2025-01-06 DIAGNOSIS — J410 Simple chronic bronchitis: Secondary | ICD-10-CM | POA: Diagnosis not present

## 2025-01-06 DIAGNOSIS — J3089 Other allergic rhinitis: Secondary | ICD-10-CM

## 2025-01-06 MED ORDER — ANZUPGO 20 MG/GM EX CREA: 1.0000 | TOPICAL_CREAM | Freq: Two times a day (BID) | CUTANEOUS | 5 refills | Status: AC | PRN

## 2025-01-06 NOTE — Patient Instructions (Addendum)
 Eyelid issues - diagnosed with ocular rosacea  - concerned this could be side effect from Dupixent  and would do a longer wash-out period of at least 2-3 months to determine if it is due to Dupixent .   We have discussed options to change to if it is due to Dupixent .  Will communicate with dermatologist regarding this - would hold Pataday for now as not getting any benefit  Asthma with COPD -Have access to albuterol  inhaler 2 puffs every 4-6 hours as needed for cough/wheeze/shortness of breath/chest tightness.  May use 15-20 minutes prior to activity.   Monitor frequency of use.   -continue Breztri 2 puffs twice a day -Continue singular 10 mg daily -Recommend stopping Dupixent  for longer duration due determine if this is the cause eyelid condition  Control goals:  Full participation in all desired activities (may need albuterol  before activity) Albuterol  use two time or less a week on average (not counting use with activity) Cough interfering with sleep two time or less a month Oral steroids no more than once a year No hospitalizations  Environmental allergies -Continue avoidance measures for dust mites, cat, dog, grass pollen, tree pollen, weed pollen, cockroach, mold -Continue levocetirizine 5 mg daily (can take additional dose if needed) -Continue Singulair as above  -For itchy, watery eyes can use over-the-counter Pataday 1 drop each eye daily as needed.   -You are eligible for allergen immunotherapy if medication management is ineffective in allergy symptom control  Atopic dermatitis with dyshidrotic eczema -Continue moisturization after bathing with thick emollient -Use Anzupgo  twice a day as needed for hand/feet eczema flares -As above hold Dupixent  for now until follow-up  Follow-up in 2-3 months or sooner if needed

## 2025-01-06 NOTE — Progress Notes (Unsigned)
 "   New Patient Note  RE: Monica Todd MRN: [id number] DOB: 1971-10-15 Date of Office Visit: 01/06/2025  Primary care provider: [name], MD  Chief Complaint: eye issues  History of present illness: Monica Todd is a 54 y.o. female presenting today for evaluation of allergic conjunctivitis.  She is a former pt of the practice last seen by myself on 10/27/21 for atopic dermatitis with dyshidrotic eczema, asthma/copd and allergic rhinitis with conjunctivitis.   She has been seen by ophthalmology and diagnosed with ocular rosacea.  She has received treatment twice for what was thought to be shingles as well as antibiotics (doxycycline as well as azithromycin), topical antibiotics (erythromycin) and steroids as well as Pataday which did not improve her symptoms.  She has noted improvement of itching with triamcinolone  cream. She is on Dupixent  for COPD and eczema control as well as Singulair and Xyzal. She was recommended by ophthalmology for ocular rosacea to start Restasis as well as provided with a rheumatology referral.   The  erythromycin ointment was also switched over to TobraDex.   She was also recommended to start azelaic acid twice a day.    Review of systems: 10pt ROS negative unless noted above in HPI  Past medical history: Past Medical History:  Diagnosis Date   Anxiety 06/15/2020   Asthma 08/25/2012   Asthma with COPD with exacerbation (HCC) 04/13/2021   Bipolar 1 disorder (HCC)    CAD (coronary artery disease) 2024   per cCTA   COPD (chronic obstructive pulmonary disease) (HCC)    Depression    Diabetes (HCC)    Type II   Eczema    Environmental allergies    High cholesterol    Hypertriglyceridemia 03/25/2018   Obesity with body mass index 30 or greater 07/12/2020   Postmenopausal state 02/19/2017   Smoker 08/25/2012   Tobacco use     Past surgical history: Past Surgical History:  Procedure Laterality Date   CESAREAN SECTION     FOOT SURGERY      HEMORRHOID SURGERY     LEFT HEART CATH AND CORONARY ANGIOGRAPHY N/A 07/17/2024   Procedure: LEFT HEART CATH AND CORONARY ANGIOGRAPHY;  Surgeon: Elmira Newman PARAS, MD;  Location: [facility];  Service: Cardiovascular;  Laterality: N/A;   THERAPEUTIC ABORTION      Family history:  Family History  Problem Relation Age of Onset   Eczema Mother    Asthma Mother    Allergic rhinitis Mother    Eczema Father    Diabetes Father    Allergic rhinitis Sister    Allergic rhinitis Sister    Diabetes Other    Cancer Other    Coronary artery disease Other    Asthma Other     Social history: Social History   Socioeconomic History   Marital status: Significant Other    Spouse name: Not on file   Number of children: Not on file   Years of education: Not on file   Highest education level: Not on file  Occupational History   Not on file  Tobacco Use   Smoking status: Every Day    Current packs/day: 0.00    Average packs/day: 1.0 packs/day    Types: Cigarettes    Last attempt to quit: 07/16/2018    Years since quitting: 6.4   Smokeless tobacco: Never  Vaping Use   Vaping status: Never Used  Substance and Sexual Activity   Alcohol use: Yes    Comment: rare  Drug use: No   Sexual activity: Yes    Birth control/protection: None  Other Topics Concern   Not on file  Social History Narrative   Not on file   Social Drivers of Health   Tobacco Use: High Risk (11/12/2024)   Patient History    Smoking Tobacco Use: Every Day    Smokeless Tobacco Use: Never    Passive Exposure: Not on file  Financial Resource Strain: Not on file  Food Insecurity: No Food Insecurity (11/12/2024)   Epic    Worried About Programme Researcher, Broadcasting/film/video in the Last Year: Never true    Ran Out of Food in the Last Year: Never true  Transportation Needs: Not on file  Physical Activity: Not on file  Stress: Not on file  Social Connections: Not on file  Intimate Partner Violence: Not on file  Depression  (PHQ2-9): Low Risk (11/12/2024)   Depression (PHQ2-9)    PHQ-2 Score: 2  Alcohol Screen: Not on file  Housing: Not on file  Utilities: Not on file  Health Literacy: Not on file    Medication List: Current Outpatient Medications  Medication Sig Dispense Refill   acetaminophen  (TYLENOL ) 650 MG CR tablet Take 1,300 mg by mouth every 8 (eight) hours as needed for pain.     albuterol  (PROVENTIL ) (2.5 MG/3ML) 0.083% nebulizer solution albuterol  sulfate 2.5 mg/3 mL (0.083 %) solution for nebulization  INHALE 1 VIAL EVERY 4-6 HOURS AS NEEDED FOR WHEEZING     albuterol  (VENTOLIN  HFA) 108 (90 Base) MCG/ACT inhaler Inhale 2 puffs into the lungs every 6 (six) hours as needed (COPD/Asthma).     aspirin  EC 81 MG tablet Take 1 tablet (81 mg total) by mouth daily. Swallow whole. 90 tablet 3   Budeson-Glycopyrrol-Formoterol 160-9-4.8 MCG/ACT AERO Inhale 2 puffs into the lungs 2 (two) times daily.     buPROPion (WELLBUTRIN XL) 150 MG 24 hr tablet Take 150 mg by mouth in the morning.     clonazePAM (KLONOPIN) 0.5 MG tablet Take 0.5 mg by mouth 2 (two) times daily as needed for anxiety.     dapagliflozin  propanediol (FARXIGA ) 10 MG TABS tablet Take 1 tablet (10 mg total) by mouth daily before breakfast. 90 tablet 3   desvenlafaxine (PRISTIQ) 50 MG 24 hr tablet Take 50 mg by mouth daily.     Dupilumab  300 MG/2ML SOAJ Inject 300 mg into the skin every 14 (fourteen) days.     ezetimibe  (ZETIA ) 10 MG tablet TAKE 1 TABLET BY MOUTH EVERY DAY 90 tablet 3   glucose blood (CONTOUR NEXT TEST) test strip 1 each by Other route 2 (two) times daily. And lancets 2/day 100 each 12   ipratropium-albuterol  (DUONEB) 0.5-2.5 (3) MG/3ML SOLN ipratropium-albuterol  0.5 mg-3 mg(2.5 mg base)/3 mL nebulization soln  INHALE 1 VIAL EVERY 6 HOURS AS NEEDED FOR WHEEZING     isosorbide  mononitrate (IMDUR ) 30 MG 24 hr tablet Take 1 tablet (30 mg total) by mouth daily. 90 tablet 3   levocetirizine (XYZAL) 5 MG tablet Take 5 mg by mouth  every evening.     lisinopril (PRINIVIL,ZESTRIL) 5 MG tablet Take 5 mg by mouth daily.  0   metFORMIN  (GLUCOPHAGE -XR) 500 MG 24 hr tablet Take 1,000 mg by mouth 2 (two) times daily with a meal.     metoprolol  succinate (TOPROL -XL) 25 MG 24 hr tablet TAKE 1 TABLET (25 MG) BY MOUTH IN THE MORNING AND AT BEDTIME 180 tablet 3   montelukast (SINGULAIR) 10 MG tablet Take  10 mg by mouth at bedtime.     naphazoline-pheniramine (NAPHCON-A) 0.025-0.3 % ophthalmic solution Place 1 drop into both eyes 4 (four) times daily as needed for eye irritation.     nitroGLYCERIN  (NITROSTAT ) 0.4 MG SL tablet Place 1 tablet (0.4 mg total) under the tongue every 5 (five) minutes as needed for chest pain. 25 tablet 1   rosuvastatin  (CRESTOR ) 20 MG tablet TAKE 1 TABLET BY MOUTH EVERY DAY 90 tablet 3   triamcinolone  cream (KENALOG ) 0.5 % Apply 1 Application topically daily as needed (Eczema).     No current facility-administered medications for this visit.    Known medication allergies: Allergies[1]   Physical examination: Last menstrual period 06/25/2012.  General: Alert, interactive, in no acute distress. HEENT: PERRLA, TMs pearly gray, turbinates {Blank single:19197::non-edematous,edematous,edematous and pale,markedly edematous,markedly edematous and pale,moderately edematous,mildly edematous,minimally edematous} {Blank single:19197::with crusty discharge,with thick discharge,with clear discharge,without discharge}, post-pharynx {Blank single:19197::unremarkable,non erythematous,erythematous,markedly erythematous,moderately erythematous,mildly erythematous}. Neck: Supple without lymphadenopathy. Lungs: {Blank single:19197::Decreased breath sounds with expiratory wheezing bilaterally,Mildly decreased breath sounds with expiratory wheezing bilaterally,Decreased breath sounds bilaterally without wheezing, rhonchi or rales,Mildly decreased breath sounds bilaterally without  wheezing, rhonchi or rales,Clear to auscultation without wheezing, rhonchi or rales}. {{Blank single:19197::increased work of breathing,no increased work of breathing}. CV: Normal S1, S2 without murmurs. Abdomen: Nondistended, nontender. Skin: {Blank single:19197::Dry, erythematous, excoriated patches on the ***,Dry, hyperpigmented, thickened patches on the ***,Dry, mildly hyperpigmented, mildly thickened patches on the ***,Scattered erythematous urticarial type lesions primarily located *** , nonvesicular,Warm and dry, without lesions or rashes}. Extremities:  No clubbing, cyanosis or edema. Neuro:   Grossly intact.  Diagnostics/Labs:  Spirometry: FEV1: 1.9L 70%, FVC: 3.02L 88%, ratio consistent with mild obstructive pattern  Assessment and plan: There are no Patient Instructions on file for this visit.  No follow-ups on file.  I appreciate the opportunity to take part in Monica Todd's care. Please do not hesitate to contact me with questions.  Sincerely,   Danita Brain, MD Allergy/Immunology [facility]     [1]  Allergies Allergen Reactions   Pollen Extract Dermatitis    Mold, Animal Dander, trees, leaves, grass pollen,dust and roaches   Azithromycin     hives   Keflex  [Cephalexin ] Rash   Penicillins Itching and Rash    Other Reaction(s): respiratory distress   Sulfa Antibiotics Itching and Rash    Other Reaction(s): respiratory distress   "

## 2025-01-19 ENCOUNTER — Telehealth: Payer: Self-pay

## 2025-01-19 NOTE — Telephone Encounter (Signed)
*  AA  Pharmacy Patient Advocate Encounter   Received notification from Fax that prior authorization for Anzupgo  is required/requested.   Insurance verification completed.   The patient is insured through [facility].   Per test claim: PA required; PA submitted to above mentioned insurance via Latent Key/confirmation #/EOC [id number] Status is pending

## 2025-01-20 ENCOUNTER — Encounter: Payer: Self-pay | Admitting: Dietician

## 2025-01-20 ENCOUNTER — Encounter: Admitting: Dietician

## 2025-01-20 DIAGNOSIS — E1165 Type 2 diabetes mellitus with hyperglycemia: Secondary | ICD-10-CM | POA: Insufficient documentation

## 2025-01-20 NOTE — Progress Notes (Signed)
 Diabetes Self-Management Education  Visit Type: Follow-up  Appt. Start Time: 1352 Appt. End Time: 1430  01/20/2025  [name], identified by name and date of birth, is a 54 y.o. female with a diagnosis of Diabetes:  .   ASSESSMENT  History includes: anxiety, asthma, COPD, depression, type 2 diabetes, HLD Labs noted: reviewed; 10/13/24: 9.6% 01/15/25 A1c 7.3%  Medications include: reviewed; farxiga , metformin , Ozempic Supplements: MVI   Pt states she has been on antibiotics, antivirals etc. Pt states she has gotten mixed messages regarding her eye symptoms.  Pt states she is taking Ozempic and experiences feelings of nausea for 2 days after taking the shot. Pt states she will take the shot on Sunday and not eat a lot on Monday/Tuesday. Pt states she primarily eats toast, chicken soup, mashed potatoes, crackers and Gingerale. Pt states she is not ready to up her dose on Ozempic.   Pt states she is hoping with the lowered A1c, she will be eligible for hand surgery.  Pt states she checks her blood sugar irregularly and it ranges between 120-140 mg/dL.   Pt states that pre-made meals from [employer] helps her incorporate vegetables.   Pt states she has well water that she cannot drink because it is not properly filtered so she only drinks Aquafina bottled water. Pt states she will drink 2-3 bottles per day.   Pt states she is still working from home. Pt states she is still smoking but only smokes outdoors.   Pt states she has not been consistently walking because of the colder weather.   Pt reports she is experiencing stress.   Pt reports she did sign up to get recipes from the [employer]. Pt states she has not tried any of them yet but has been inspired.  Pt states she had gestational diabetes when she was pregnant 22 years ago.    Last menstrual period 06/25/2012. There is no height or weight on file to calculate BMI.   Diabetes Self-Management Education - 01/20/25 1345        Visit Information   Visit Type Follow-up      Psychosocial Assessment   What is the hardest part about your diabetes right now, causing you the most concern, or is the most worrisome to you about your diabetes?   Being active;Getting support / problem solving    Self-care barriers None    Self-management support Doctor's office    Other persons present Patient    Patient Concerns Medication;Healthy Lifestyle;Support    Preferred Learning Style No preference indicated    Learning Readiness Ready    How often do you need to have someone help you when you read instructions, pamphlets, or other written materials from your doctor or pharmacy? 1 - Never      Pre-Education Assessment   Patient understands the diabetes disease and treatment process. Needs Review    Patient understands incorporating nutritional management into lifestyle. Needs Review    Patient undertands incorporating physical activity into lifestyle. Needs Review    Patient understands using medications safely. Needs Review    Patient understands monitoring blood glucose, interpreting and using results Needs Review    Patient understands prevention, detection, and treatment of acute complications. Needs Review    Patient understands prevention, detection, and treatment of chronic complications. Needs Review    Patient understands how to develop strategies to address psychosocial issues. Needs Review    Patient understands how to develop strategies to promote health/change behavior. Needs Review  Complications   Last HgB A1C per patient/outside source 7.3 %    How often do you check your blood sugar? 3-4 times / week    Fasting Blood glucose range (mg/dL) 29-870;869-820    Postprandial Blood glucose range (mg/dL) 869-820;29-870      Dietary Intake   Breakfast greek yogurt with granola or fruit OR cottage cheese and fruit OR saltines    Snack (morning) crackers OR nuts OR sugar free jello    Lunch salad with cucumber,  tomato, cauliflower, canned chicken OR sandwich OR nuts    Snack (afternoon) crackers OR nuts OR sugar free jello    Dinner honey glazed salmon, rice, brussel sprouts from [employer]    Beverage(s) coffee before 3pm, water, occasionally mini canned gingerale      Activity / Exercise   Activity / Exercise Type ADL's      Patient Education   Disease Pathophysiology Explored patient's options for treatment of their diabetes    Healthy Eating Role of diet in the treatment of diabetes and the relationship between the three main macronutrients and blood glucose level;Plate Method    Being Active Role of exercise on diabetes management, blood pressure control and cardiac health.;Helped patient identify appropriate exercises in relation to his/her diabetes, diabetes complications and other health issue.    Medications Reviewed patients medication for diabetes, action, purpose, timing of dose and side effects.    Chronic complications Identified and discussed with patient  current chronic complications    Diabetes Stress and Support Worked with patient to identify barriers to care and solutions;Helped patient identify a support system for diabetes management;Brainstormed with patient on coping mechanisms for social situations, getting support from significant others, dealing with feelings about diabetes;Role of stress on diabetes    Lifestyle and Health Coping Lifestyle issues that need to be addressed for better diabetes care;Review risk of smoking and offered smoking cessation      Individualized Goals (developed by patient)   Nutrition General guidelines for healthy choices and portions discussed    Physical Activity Exercise 3-5 times per week;15 minutes per day    Medications take my medication as prescribed    Problem Solving Eating Pattern;Addressing barriers to behavior change    Reducing Risk stop smoking;examine blood glucose patterns    Health Coping Discuss barriers to diabetes care with  support person/system (comment specifics as needed);Ask for help with psychological, social, or emotional issues      Patient Self-Evaluation of Goals - Patient rates self as meeting previously set goals (% of time)   Nutrition 50 - 75 % (half of the time)    Physical Activity < 25% (hardly ever/never)    Medications >75% (most of the time)    Monitoring 25 - 50% (sometimes)    Problem Solving and behavior change strategies  50 - 75 % (half of the time)    Reducing Risk (treating acute and chronic complications) 50 - 75 % (half of the time)    Health Coping 25 - 50% (sometimes)      Post-Education Assessment   Patient understands the diabetes disease and treatment process. Comprehends key points    Patient understands incorporating nutritional management into lifestyle. Comprehends key points    Patient undertands incorporating physical activity into lifestyle. Comprehends key points    Patient understands using medications safely. Demonstrates understanding / competency    Patient understands monitoring blood glucose, interpreting and using results Comprehends key points    Patient understands prevention, detection, and  treatment of acute complications. Comprehends key points    Patient understands prevention, detection, and treatment of chronic complications. Comprehends key points    Patient understands how to develop strategies to address psychosocial issues. Comprehends key points    Patient understands how to develop strategies to promote health/change behavior. Comprehends key points      Outcomes   Expected Outcomes Demonstrated interest in learning but significant barriers to change    Future DMSE 2 months    Program Status Not Completed      Subsequent Visit   Since your last visit have you continued or begun to take your medications as prescribed? Yes    Since your last visit, are you checking your blood glucose at least once a day? No          Individualized Plan for  Diabetes Self-Management Training:   Learning Objective:  Patient will have a greater understanding of diabetes self-management. Patient education plan is to attend individual and/or group sessions per assessed needs and concerns.   Plan:   Assessment of Previous Goals Established by Patient:    Goal 1: go walking in the driveway for at least 15 minutes 3-5 days a week. Goal not met: continue working   Goal 2: aim for 3 water bottles daily. Start drinking water earlier in the day. Goal met: Work towards 3-4 bottles per day.  Expected Outcomes:  Demonstrated interest in learning but significant barriers to change  Education material provided:   If problems or questions, patient to contact team via:  Phone and Email  Future DSME appointment: 2 months

## 2025-01-21 NOTE — Telephone Encounter (Signed)
 Plan stated to resubmit- new Key: [id number]

## 2025-01-21 NOTE — Telephone Encounter (Signed)
 Submitted to plan and pending determination.

## 2025-01-21 NOTE — Telephone Encounter (Signed)
 Your request has been approved Your PA request has been approved. Additional information will be provided in the approval communication. (Message 1145) Authorization Expiration01/28/2027

## 2025-03-10 ENCOUNTER — Ambulatory Visit: Admitting: Allergy

## 2025-04-21 ENCOUNTER — Encounter: Admitting: Dietician
